# Patient Record
Sex: Female | Born: 1990 | Race: White | Hispanic: No | Marital: Single | Smoking: Never smoker
Health system: Southern US, Community
[De-identification: ages and names within clinical notes are randomized; demographics above are authoritative.]

## PROBLEM LIST (undated history)

## (undated) DIAGNOSIS — N946 Dysmenorrhea, unspecified: Secondary | ICD-10-CM

## (undated) HISTORY — DX: Dysmenorrhea, unspecified: N94.6

## (undated) HISTORY — PX: SKIN BIOPSY: SHX1

## (undated) HISTORY — PX: NO PAST SURGERIES: SHX2092

---
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
## 2012-05-10 ENCOUNTER — Other Ambulatory Visit: Payer: Self-pay | Admitting: Internal Medicine

## 2012-05-10 ENCOUNTER — Telehealth: Payer: Self-pay | Admitting: Internal Medicine

## 2012-05-10 MED ORDER — NORETHINDRONE-ETH ESTRADIOL 1-35 MG-MCG PO TABS: 1.0000 | ORAL_TABLET | Freq: Every day | ORAL | Status: DC

## 2012-05-10 NOTE — Telephone Encounter (Signed)
Vickie/Pharmacist w/ [facility] in [place], [phone],  calling regarding Nicon birth control is no longer manufactured, need substitute.  PLEASE FOLLOW UP W/ PHARMACY OR RESEND ESCRIPT.

## 2012-05-11 MED ORDER — NORETHINDRONE-ETH ESTRADIOL 1-35 MG-MCG PO TABS: 1.0000 | ORAL_TABLET | Freq: Every day | ORAL | Status: DC

## 2012-05-11 NOTE — Progress Notes (Signed)
Med faxed to [facility] in [place].

## 2013-05-18 ENCOUNTER — Telehealth: Payer: Self-pay | Admitting: *Deleted

## 2013-05-18 NOTE — Telephone Encounter (Signed)
Refill Request  Alyacen 1-35-28 tablet   #28  Take one tablet by mouth every day

## 2013-05-18 NOTE — Telephone Encounter (Signed)
Call Pt and told her we are unable to refill her Rx request due to not being seen in office since 06/21/11. Told pt she will need to make an appt with PCP to renew Rx. Pt verbalized understanding and said she would call back to schedule.

## 2013-05-21 ENCOUNTER — Telehealth: Payer: Self-pay | Admitting: *Deleted

## 2013-05-21 NOTE — Telephone Encounter (Signed)
Refill Request  Alyacen 1-35-28 tablet  Take one tablet by mouth every day

## 2013-05-21 NOTE — Telephone Encounter (Signed)
Medication will not be refilled, please refer to previous phone encounter

## 2015-03-24 ENCOUNTER — Ambulatory Visit: Payer: BC Managed Care – PPO | Admitting: Internal Medicine

## 2015-03-26 ENCOUNTER — Ambulatory Visit (INDEPENDENT_AMBULATORY_CARE_PROVIDER_SITE_OTHER): Payer: BC Managed Care – PPO | Admitting: Internal Medicine

## 2015-03-26 ENCOUNTER — Encounter: Payer: Self-pay | Admitting: Internal Medicine

## 2015-03-26 VITALS — BP 122/80 | HR 63 | Temp 98.1°F | Resp 12 | Ht 71.0 in | Wt 221.8 lb

## 2015-03-26 DIAGNOSIS — M25561 Pain in right knee: Secondary | ICD-10-CM

## 2015-03-26 DIAGNOSIS — M25562 Pain in left knee: Secondary | ICD-10-CM

## 2015-03-26 DIAGNOSIS — E669 Obesity, unspecified: Secondary | ICD-10-CM

## 2015-03-26 DIAGNOSIS — R5383 Other fatigue: Secondary | ICD-10-CM | POA: Diagnosis not present

## 2015-03-26 DIAGNOSIS — E559 Vitamin D deficiency, unspecified: Secondary | ICD-10-CM | POA: Diagnosis not present

## 2015-03-26 NOTE — Progress Notes (Signed)
Subjective:  Patient ID: Kristina Woodard, female    DOB: Aug 14, 1990  Age: 25 y.o. MRN: [id number]  CC: The primary encounter diagnosis was Other fatigue. Diagnoses of Obesity, Vitamin D deficiency, and Bilateral knee pain were also pertinent to this visit.  HPI Kristina Woodard presents for bilateral knee pain.. patient has not been seen since 2013  History of right knee patella dislocation from blunt trauma that occurrent when she was an adolescent and her dog ran into her knee .  The patellar dislocation was reduced by ER physician  Right knee has been intermittently problematic .  But the left knee is more often a problem.   She reports pain with exercise and with pronged rest.  The pain is diffuse and anterior in location.  She denies puffiness and warmth to either joints. .     History Kristina Woodard has no past medical history on file.   She has no past surgical history on file.   Her family history includes Cancer in her maternal grandmother and paternal aunt.She reports that she has never smoked. She has never used smokeless tobacco. She reports that she drinks alcohol. She reports that she does not use illicit drugs.  Outpatient Prescriptions Prior to Visit  Medication Sig Dispense Refill  . norethindrone-ethinyl estradiol 1/35 (ORTHO-NOVUM 1/35, 28,) tablet Take 1 tablet by mouth daily. (Patient not taking: Reported on 03/26/2015) 1 Package 11  . Norethindrone-Ethinyl Estradiol Biphasic (NECON 10/11, 28,) 0.5-35/1-35 MG-MCG tablet Take 1 tablet by mouth daily. 1 Package 11  . Clindamycin Phos-Benzoyl Perox (ACANYA) gel Apply topically 2 (two) times daily. Reported on 03/26/2015    . tretinoin (RETIN-A) 0.1 % cream Apply topically at bedtime. Reported on 03/26/2015     No facility-administered medications prior to visit.    Review of Systems:  Patient denies headache, fevers, malaise, unintentional weight loss, skin rash, eye pain, sinus congestion and sinus pain, sore throat,  dysphagia,  hemoptysis , cough, dyspnea, wheezing, chest pain, palpitations, orthopnea, edema, abdominal pain, nausea, melena, diarrhea, constipation, flank pain, dysuria, hematuria, urinary  Frequency, nocturia, numbness, tingling, seizures,  Focal weakness, Loss of consciousness,  Tremor, insomnia, depression, anxiety, and suicidal ideation.     Objective:  BP 122/80 mmHg  Pulse 63  Temp(Src) 98.1 F (36.7 C) (Oral)  Resp 12  Ht  (1.803 m)  Wt 221 lb 12 oz (100.585 kg)  BMI 30.94 kg/m2  SpO2 96%  LMP 03/19/2015 (Approximate)  Physical Exam:  General appearance: alert, cooperative and appears stated age Ears: normal TM's and external ear canals both ears Throat: lips, mucosa, and tongue normal; teeth and gums normal Neck: no adenopathy, no carotid bruit, supple, symmetrical, trachea midline and thyroid not enlarged, symmetric, no tenderness/mass/nodules Back: symmetric, no curvature. ROM normal. No CVA tenderness. Lungs: clear to auscultation bilaterally Heart: regular rate and rhythm, S1, S2 normal, no murmur, click, rub or gallop Abdomen: soft, non-tender; bowel sounds normal; no masses,  no organomegaly Pulses: 2+ and symmetric Skin: Skin color, texture, turgor normal. No rashes or lesions Lymph nodes: Cervical, supraclavicular, and axillary nodes normal.   Assessment & Plan:   Problem List Items Addressed This Visit    Bilateral knee pain    Likely secondary to degenerative changes . She wants to avoid medications and wants to be proactive to prevent damage. .  Discussed using turmeric   Referral to sports medicine       Relevant Orders   Ambulatory referral to Sports Medicine   Vitamin D  deficiency   Relevant Orders   VITAMIN D 25 Hydroxy (Vit-D Deficiency, Fractures) (Completed)    Other Visit Diagnoses    Other fatigue    -  Primary    Relevant Orders    Comprehensive metabolic panel (Completed)    TSH (Completed)    CBC with Differential/Platelet  (Completed)    Obesity        Relevant Orders    Lipid panel (Completed)       I have discontinued Ms. Stretch's Clindamycin Phos-Benzoyl Perox and tretinoin. I am also having her start on ergocalciferol. Additionally, I am having her maintain her Norethindrone-Ethinyl Estradiol Biphasic, norethindrone-ethinyl estradiol 1/35, and Adapalene-Benzoyl Peroxide (EPIDUO FORTE EX).  Meds ordered this encounter  Medications  . Adapalene-Benzoyl Peroxide (EPIDUO FORTE EX)    Sig: Apply 1 application topically daily.  . ergocalciferol (DRISDOL) 50000 units capsule    Sig: Take 1 capsule (50,000 Units total) by mouth once a week.    Dispense:  12 capsule    Refill:  0    Medications Discontinued During This Encounter  Medication Reason  . Clindamycin Phos-Benzoyl Perox (ACANYA) gel Change in therapy  . tretinoin (RETIN-A) 0.1 % cream Completed Course    Follow-up: No Follow-up on file.   Sherlene Shams, MD

## 2015-03-26 NOTE — Progress Notes (Signed)
Pre-visit discussion using our clinic review tool. No additional management support is needed unless otherwise documented below in the visit note.  

## 2015-03-26 NOTE — Patient Instructions (Signed)
I am referring you to Dr Terrilee Files to evaluate your knees  You can use turmeric capsules as a natural anti inflammatory along with ice  Avoid the Stair master for now.  Ok to continue the [employer]

## 2015-03-27 LAB — CBC WITH DIFFERENTIAL/PLATELET
BASOS ABS: 0 10*3/uL (ref 0.0–0.1)
Basophils Relative: 0.3 % (ref 0.0–3.0)
EOS ABS: 0.3 10*3/uL (ref 0.0–0.7)
Eosinophils Relative: 4 % (ref 0.0–5.0)
HEMATOCRIT: 40 % (ref 36.0–46.0)
HEMOGLOBIN: 13.5 g/dL (ref 12.0–15.0)
LYMPHS PCT: 39.2 % (ref 12.0–46.0)
Lymphs Abs: 2.5 10*3/uL (ref 0.7–4.0)
MCHC: 33.8 g/dL (ref 30.0–36.0)
MCV: 85.9 fl (ref 78.0–100.0)
Monocytes Absolute: 0.4 10*3/uL (ref 0.1–1.0)
Monocytes Relative: 6.7 % (ref 3.0–12.0)
NEUTROS ABS: 3.1 10*3/uL (ref 1.4–7.7)
Neutrophils Relative %: 49.8 % (ref 43.0–77.0)
PLATELETS: 371 10*3/uL (ref 150.0–400.0)
RBC: 4.66 Mil/uL (ref 3.87–5.11)
RDW: 12.8 % (ref 11.5–15.5)
WBC: 6.3 10*3/uL (ref 4.0–10.5)

## 2015-03-27 LAB — COMPREHENSIVE METABOLIC PANEL
ALBUMIN: 4.8 g/dL (ref 3.5–5.2)
ALK PHOS: 33 U/L — AB (ref 39–117)
ALT: 27 U/L (ref 0–35)
AST: 19 U/L (ref 0–37)
BUN: 11 mg/dL (ref 6–23)
CALCIUM: 9.8 mg/dL (ref 8.4–10.5)
CO2: 23 mEq/L (ref 19–32)
Chloride: 107 mEq/L (ref 96–112)
Creatinine, Ser: 0.68 mg/dL (ref 0.40–1.20)
GFR: 114.71 mL/min (ref >60.00)
Glucose, Bld: 86 mg/dL (ref 70–99)
POTASSIUM: 3.8 meq/L (ref 3.5–5.1)
Sodium: 144 mEq/L (ref 135–145)
TOTAL PROTEIN: 8.1 g/dL (ref 6.0–8.3)
Total Bilirubin: 0.4 mg/dL (ref 0.2–1.2)

## 2015-03-27 LAB — TSH: TSH: 0.78 u[IU]/mL (ref 0.35–4.50)

## 2015-03-27 LAB — LIPID PANEL
CHOL/HDL RATIO: 3
Cholesterol: 129 mg/dL (ref 0–200)
HDL: 48.7 mg/dL (ref >39.00)
LDL Cholesterol: 63 mg/dL (ref 0–99)
NonHDL: 80.25
TRIGLYCERIDES: 87 mg/dL (ref 0.0–149.0)
VLDL: 17.4 mg/dL (ref 0.0–40.0)

## 2015-03-27 LAB — VITAMIN D 25 HYDROXY (VIT D DEFICIENCY, FRACTURES): VITD: 21.98 ng/mL — AB (ref 30.00–100.00)

## 2015-03-28 DIAGNOSIS — E559 Vitamin D deficiency, unspecified: Secondary | ICD-10-CM | POA: Insufficient documentation

## 2015-03-28 MED ORDER — ERGOCALCIFEROL 1.25 MG (50000 UT) PO CAPS: 50000.0000 [IU] | ORAL_CAPSULE | ORAL | Status: DC

## 2015-03-29 DIAGNOSIS — M25562 Pain in left knee: Secondary | ICD-10-CM

## 2015-03-29 DIAGNOSIS — M25561 Pain in right knee: Secondary | ICD-10-CM | POA: Insufficient documentation

## 2015-03-29 NOTE — Assessment & Plan Note (Signed)
Likely secondary to degenerative changes . She wants to avoid medications and wants to be proactive to prevent damage. .  Discussed using turmeric   Referral to sports medicine

## 2015-04-10 ENCOUNTER — Ambulatory Visit: Payer: BC Managed Care – PPO | Admitting: Family Medicine

## 2015-04-16 ENCOUNTER — Ambulatory Visit (INDEPENDENT_AMBULATORY_CARE_PROVIDER_SITE_OTHER): Payer: BC Managed Care – PPO | Admitting: Family Medicine

## 2015-04-16 ENCOUNTER — Encounter: Payer: Self-pay | Admitting: Family Medicine

## 2015-04-16 ENCOUNTER — Other Ambulatory Visit (INDEPENDENT_AMBULATORY_CARE_PROVIDER_SITE_OTHER): Payer: BC Managed Care – PPO

## 2015-04-16 VITALS — BP 124/80 | HR 92 | Wt 217.0 lb

## 2015-04-16 DIAGNOSIS — M222X1 Patellofemoral disorders, right knee: Secondary | ICD-10-CM | POA: Diagnosis not present

## 2015-04-16 DIAGNOSIS — M25561 Pain in right knee: Secondary | ICD-10-CM | POA: Diagnosis not present

## 2015-04-16 DIAGNOSIS — M25562 Pain in left knee: Secondary | ICD-10-CM | POA: Diagnosis not present

## 2015-04-16 DIAGNOSIS — M222X2 Patellofemoral disorders, left knee: Secondary | ICD-10-CM

## 2015-04-16 DIAGNOSIS — E559 Vitamin D deficiency, unspecified: Secondary | ICD-10-CM

## 2015-04-16 MED ORDER — DICLOFENAC SODIUM 2 % TD SOLN: 2.0000 "application " | Freq: Two times a day (BID) | TRANSDERMAL | Status: DC

## 2015-04-16 NOTE — Patient Instructions (Signed)
Good to see you.  Ice 20 minutes 2 times daily. Usually after activity and before bed. Exercises 3 times a week.  Biking for cardio for now Avoid high impact like jumping or running pennsaid pinkie amount topically 2 times daily as needed.  Continue the vitamin D  Turmeric 500mg  daily could help with inflammation  See me again in 3-6 weeks.

## 2015-04-16 NOTE — Assessment & Plan Note (Signed)
Patellofemoral Syndrome  Reviewed anatomy using anatomical model and how PFS occurs.  Given rehab exercises handout for VMO, hip abductors, core, entire kinetic chain including proprioception exercises including cone touches, step downs, hip elevations and turn outs.  Could benefit from PT, regular exercise, upright biking, and a PFS knee brace to assist with tracking abnormalities.  return to clinic in 3-6 weeks. Patient come for topical anti-and plantar is given.

## 2015-04-16 NOTE — Progress Notes (Signed)
Tawana ScaleZach Nevada Kirchner D.O. [facility] [address] [place], [place] [address] Phone: [phone] Subjective:    I'm seeing this patient by the request  of:  TULLO, TERESA L, MD   CC:  Bilateral knee pain right greater than left  MVE:[id number]:Subjective Kristina Woodard is a 25 y.o. female coming in with complaint of  Bilateral knee pain. Patient does give a past medical history significant for right-sided patella dislocation many years ago that did cause bracing for greater than 6 weeks she states. Patient did not have to have any surgical intervention.  Patient states that she did start increasing her activity and started working on a more regular basis. Patient noticed when she did more activity she started having more anterior knee pain. Patient states sometimes it felt like there is a fullness in her knees. Seems to be worse in the right and left side. Denies any giving out on her. Denies any numbness or tingling. Rates the severity of pain a 6 out of 10 when it did get bad. States that when she does not do as much activity it seems to get better.     No past medical history on file. history of a patellar's dislocation on the right knee many years ago No past surgical history on file. Social History   Social History  . Marital Status: Single    Spouse Name: N/A  . Number of Children: N/A  . Years of Education: N/A   Social History Main Topics  . Smoking status: Never Smoker   . Smokeless tobacco: Never Used  . Alcohol Use: Yes  . Drug Use: No  . Sexual Activity: Not Asked   Other Topics Concern  . None   Social History Narrative   Allergies  Allergen Reactions  . Benadryl [Diphenhydramine]    Family History  Problem Relation Age of Onset  . Cancer Paternal Aunt     ovarian  . Cancer Maternal Grandmother     melanoma ,  metastatic colon Ca    Past medical history, social, surgical and family history all reviewed in electronic medical record.  No pertanent information  unless stated regarding to the chief complaint.   Review of Systems: No headache, visual changes, nausea, vomiting, diarrhea, constipation, dizziness, abdominal pain, skin rash, fevers, chills, night sweats, weight loss, swollen lymph nodes, body aches, joint swelling, muscle aches, chest pain, shortness of breath, mood changes.   Objective Blood pressure 124/80, pulse 92, weight 217 lb (98.431 kg), last menstrual period 03/19/2015, SpO2 99 %.  General: No apparent distress alert and oriented x3 mood and affect normal, dressed appropriately.  HEENT: Pupils equal, extraocular movements intact  Respiratory: Patient's speak in full sentences and does not appear short of breath  Cardiovascular: No lower extremity edema, non tender, no erythema  Skin: Warm dry intact with no signs of infection or rash on extremities or on axial skeleton.  Abdomen: Soft nontender  Neuro: Cranial nerves II through XII are intact, neurovascularly intact in all extremities with 2+ DTRs and 2+ pulses.  Lymph: No lymphadenopathy of posterior or anterior cervical chain or axillae bilaterally.  Gait normal with good balance and coordination.  MSK:  Non tender with full range of motion and good stability and symmetric strength and tone of shoulders, elbows, wrist, hip, and ankles bilaterally.  Knee: Bilateral Normal to inspection with no erythema or effusion or obvious bony abnormalities. Mild lateral  Tilt of the knees Bilaterally Palpation normal with no warmth, joint line tenderness,  patellar tenderness, or condyle tenderness. ROM full in flexion and extension and lower leg rotation. Ligaments with solid consistent endpoints including ACL, PCL, LCL, MCL. Negative Mcmurray's, Apley's, and Thessalonian tests. Non painful patellar compression. Patellar glide without crepitus. Patellar and quadriceps tendons unremarkable. Hamstring and quadriceps strength is normal.   MSK US performed of:  bilateral This study was  ordered, performed, and interpreted by Terrilee Files D.O.  Knee: All structures visualized. Anteromedial, anterolateral, posteromedial, and posterolateral menisci unremarkable without tearing, fraying, effusion, or displacement. Patellar Tendon unremarkable on long and transverse views without effusion. No abnormality of prepatellar bursa. LCL and MCL unremarkable on long and transverse views. No abnormality of origin of medial or lateral head of the gastrocnemius.  IMPRESSION:  NORMAL ULTRASONOGRAPHIC EXAMINATION OF THE KNEE.   Procedure note 97110; 15 minutes spent for Therapeutic exercises as stated in above notes.  This included exercises focusing on stretching, strengthening, with significant focus on eccentric aspects.   Patellofemoral Syndrome Rehab  Isometric contractions of thigh - 10 x 10 secs  3 way straight leg raises - build to 3 sets of 30 and then add weights begin with no weight. When 3 x 30 reached, Add 2 lb. ankle weight. Increase to 3,4,5,6 when 3x30 achieved.  Drop squats - limit to 45 deg, 3x15  Modified lunge - running position, 3x15  Seated quad extensions, 3x15, add ankle weights  Step downs, 3x15 with body weight slowly on downward phase  Knee up and open hip: knee up and externally rotate hip to open position, hold 2 sec and repeat each leg, 30 reps  Cone Drills: Right Leg, Right Hand Right Leg, Left Hand Left Leg, Right Hand Left Leg, Left Hand Start with 1 cone, progress to 3 20 each exercise  Lateral Leg Reach Balance knee, reach out laterally to cone and touch. Hold at Knee up position for 2 seconds 20 reps each leg  Rear Leg Reach Directly behind - place cone 20 reps  Proper technique shown and discussed handout in great detail with ATC.  All questions were discussed and answered.   Impression and Recommendations:     This case required medical decision making of moderate complexity.      Note: This dictation was prepared with  Dragon dictation along with smaller phrase technology. Any transcriptional errors that result from this process are unintentional.

## 2015-04-16 NOTE — Assessment & Plan Note (Signed)
Encourage patient to continue the once weekly vitamin D. 

## 2015-05-22 ENCOUNTER — Ambulatory Visit: Payer: BC Managed Care – PPO | Admitting: Family Medicine

## 2015-06-25 ENCOUNTER — Other Ambulatory Visit: Payer: Self-pay | Admitting: Internal Medicine

## 2015-10-06 ENCOUNTER — Ambulatory Visit: Payer: BC Managed Care – PPO | Admitting: Family

## 2015-12-29 ENCOUNTER — Encounter: Payer: BC Managed Care – PPO | Admitting: Internal Medicine

## 2016-02-13 ENCOUNTER — Ambulatory Visit (INDEPENDENT_AMBULATORY_CARE_PROVIDER_SITE_OTHER): Payer: BC Managed Care – PPO | Admitting: Internal Medicine

## 2016-02-13 ENCOUNTER — Encounter: Payer: Self-pay | Admitting: Internal Medicine

## 2016-02-13 ENCOUNTER — Other Ambulatory Visit (HOSPITAL_COMMUNITY)
Admission: RE | Admit: 2016-02-13 | Discharge: 2016-02-13 | Disposition: A | Discharge status: Home / Self Care | Payer: BC Managed Care – PPO | Source: Ambulatory Visit | Attending: Internal Medicine | Admitting: Internal Medicine

## 2016-02-13 VITALS — BP 108/64 | HR 92 | Temp 97.6°F | Resp 16 | Ht 71.0 in | Wt 205.0 lb

## 2016-02-13 DIAGNOSIS — Z23 Encounter for immunization: Secondary | ICD-10-CM

## 2016-02-13 DIAGNOSIS — Z3009 Encounter for other general counseling and advice on contraception: Secondary | ICD-10-CM

## 2016-02-13 DIAGNOSIS — Z30430 Encounter for insertion of intrauterine contraceptive device: Secondary | ICD-10-CM

## 2016-02-13 DIAGNOSIS — Z113 Encounter for screening for infections with a predominantly sexual mode of transmission: Secondary | ICD-10-CM | POA: Diagnosis not present

## 2016-02-13 DIAGNOSIS — Z0000 Encounter for general adult medical examination without abnormal findings: Secondary | ICD-10-CM | POA: Diagnosis not present

## 2016-02-13 DIAGNOSIS — Z01419 Encounter for gynecological examination (general) (routine) without abnormal findings: Secondary | ICD-10-CM | POA: Diagnosis present

## 2016-02-13 DIAGNOSIS — R5383 Other fatigue: Secondary | ICD-10-CM

## 2016-02-13 DIAGNOSIS — E7800 Pure hypercholesterolemia, unspecified: Secondary | ICD-10-CM

## 2016-02-13 DIAGNOSIS — Z124 Encounter for screening for malignant neoplasm of cervix: Secondary | ICD-10-CM

## 2016-02-13 DIAGNOSIS — E559 Vitamin D deficiency, unspecified: Secondary | ICD-10-CM

## 2016-02-13 DIAGNOSIS — Z1151 Encounter for screening for human papillomavirus (HPV): Secondary | ICD-10-CM | POA: Diagnosis present

## 2016-02-13 MED ORDER — ONDANSETRON 4 MG PO TBDP: 4.0000 mg | ORAL_TABLET | Freq: Three times a day (TID) | ORAL | 0 refills | Status: DC | PRN

## 2016-02-13 MED ORDER — IBUPROFEN 600 MG PO TABS: 600.0000 mg | ORAL_TABLET | Freq: Three times a day (TID) | ORAL | 0 refills | Status: DC | PRN

## 2016-02-13 NOTE — Progress Notes (Signed)
Patient ID: Kristina Woodard, female    DOB: 1990-03-30  Age: 26 y.o. MRN: [id number]  The patient is here for annual  gynexamination and management of other chronic and acute problems.  Has lost 12 lbs since march  Wants to discuss birth control. IUD preferred  Gyn referral needed  Immunization records needed,  periods are regular In occurrence and very heavy for 2 days,  Last 6-7 days at times has severe cramps with nausea. On  day 2.   Works Engineering geologist at [employer]   The risk factors are reflected in the social history.  The roster of all physicians providing medical care to patient - is listed in the Snapshot section of the chart.  Home safety : The patient has smoke detectors in the home. They wear seatbelts.  There are no firearms at home. There is no violence in the home.   There is no risks for hepatitis, STDs or HIV. There is no   history of blood transfusion. They have no travel history to infectious disease endemic areas of the world.  The patient has seen their dentist in the last six month. They have seen their eye doctor in the last year.    They do not  have excessive sun exposure. Discussed the need for sun protection: hats, long sleeves and use of sunscreen if there is significant sun exposure.   Diet: the importance of a healthy diet is discussed. They do have a healthy diet.  The benefits of regular aerobic exercise were discussed. She exercises 3,  60 minutes.   Depression screen: there are no signs or vegative symptoms of depression- irritability, change in appetite, anhedonia, sadness/tearfullness.  The following portions of the patient's history were reviewed and updated as appropriate: allergies, current medications, past family history, past medical history,  past surgical history, past social history  and problem list.  Visual acuity was not assessed per patient preference since she has regular follow up with her ophthalmologist. Hearing and body mass index were assessed  and reviewed.   During the course of the visit the patient was educated and counseled about appropriate screening and preventive services including : fall prevention , diabetes screening, nutrition counseling, colorectal cancer screening, and recommended immunizations.    CC: The primary encounter diagnosis was Screen for STD (sexually transmitted disease). Diagnoses of Cervical cancer screening, Encounter for insertion of intrauterine contraceptive device (IUD), Encounter for immunization, Vitamin D deficiency, Fatigue, unspecified type, Pure hypercholesterolemia, Encounter for preventive health examination, and Encounter for counseling regarding contraception were also pertinent to this visit.  History Kristina Woodard has no past medical history on file.   She has no past surgical history on file.   Her family history includes Cancer in her maternal grandmother and paternal aunt.She reports that she has never smoked. She has never used smokeless tobacco. She reports that she drinks alcohol. She reports that she does not use drugs.  Outpatient Medications Prior to Visit  Medication Sig Dispense Refill  . Diclofenac Sodium (PENNSAID) 2 % SOLN Place 2 application onto the skin 2 (two) times daily. 112 g 3  . Vitamin D, Ergocalciferol, (DRISDOL) 50000 units CAPS capsule TAKE 1 CAPSULE BY MOUTH ONCE WEEKLY 12 capsule 3  . Adapalene-Benzoyl Peroxide (EPIDUO FORTE EX) Apply 1 application topically daily.    . norethindrone-ethinyl estradiol 1/35 (ORTHO-NOVUM 1/35, 28,) tablet Take 1 tablet by mouth daily. (Patient not taking: Reported on 03/26/2015) 1 Package 11  . Norethindrone-Ethinyl Estradiol Biphasic (NECON 10/11, 28,) 0.5-35/1-35 MG-MCG tablet  Take 1 tablet by mouth daily. 1 Package 11   No facility-administered medications prior to visit.     Review of Systems   Patient denies headache, fevers, malaise, unintentional weight loss, skin rash, eye pain, sinus congestion and sinus pain, sore throat,  dysphagia,  hemoptysis , cough, dyspnea, wheezing, chest pain, palpitations, orthopnea, edema, abdominal pain, nausea, melena, diarrhea, constipation, flank pain, dysuria, hematuria, urinary  Frequency, nocturia, numbness, tingling, seizures,  Focal weakness, Loss of consciousness,  Tremor, insomnia, depression, anxiety, and suicidal ideation.      Objective:  BP 108/64   Pulse 92   Temp 97.6 F (36.4 C) (Oral)   Resp 16   Ht 5\' 11"  (1.803 m)   Wt 205 lb (93 kg)   LMP 01/26/2016   SpO2 100%   BMI 28.59 kg/m   Physical Exam   General Appearance:    Alert, cooperative, no distress, appears stated age  Head:    Normocephalic, without obvious abnormality, atraumatic  Eyes:    PERRL, conjunctiva/corneas clear, EOM's intact, fundi    benign, both eyes  Ears:    Normal TM's and external ear canals, both ears  Nose:   Nares normal, septum midline, mucosa normal, no drainage    or sinus tenderness  Throat:   Lips, mucosa, and tongue normal; teeth and gums normal  Neck:   Supple, symmetrical, trachea midline, no adenopathy;    thyroid:  no enlargement/tenderness/nodules; no carotid   bruit or JVD  Back:     Symmetric, no curvature, ROM normal, no CVA tenderness  Lungs:     Clear to auscultation bilaterally, respirations unlabored  Chest Wall:    No tenderness or deformity   Heart:    Regular rate and rhythm, S1 and S2 normal, no murmur, rub   or gallop  Breast Exam:    No tenderness, masses, or nipple abnormality  Abdomen:     Soft, non-tender, bowel sounds active all four quadrants,    no masses, no organomegaly  Genitalia:    Pelvic: cervix normal in appearance, external genitalia normal, no adnexal masses or tenderness, no cervical motion tenderness, rectovaginal septum normal, uterus normal size, shape, and consistency and vagina normal without discharge  Extremities:   Extremities normal, atraumatic, no cyanosis or edema  Pulses:   2+ and symmetric all extremities  Skin:   Skin  color, texture, turgor normal, no rashes or lesions  Lymph nodes:   Cervical, supraclavicular, and axillary nodes normal  Neurologic:   CNII-XII intact, normal strength, sensation and reflexes    throughout      Assessment & Plan:   Problem List Items Addressed This Visit    Encounter for counseling regarding contraception    discussed alternatives to oral contraceptives, including depo Provera, Implanon and IUD.  Referral to dr cherry for IUD       Encounter for preventive health examination    Annual wellness  exam was done as well as a comprehensive physical exam  .  During the course of the visit the patient was educated and counseled about appropriate screening and preventive services and screenings were brought up to date for cervical and breast cancer .  She will return for fasting labs to provide samples for diabetes screening and lipid analysis with projected  10 year  risk for CAD. nutrition counseling, skin cancer screening has been recommended, along with review of the age appropriate recommended immunizations.  Printed recommendations for health maintenance screenings was given.  Vitamin D deficiency   Relevant Orders   VITAMIN D 25 Hydroxy (Vit-D Deficiency, Fractures)    Other Visit Diagnoses    Screen for STD (sexually transmitted disease)    -  Primary   Relevant Orders   Cytology - PAP   Cervical cancer screening       Relevant Orders   Cytology - PAP   Encounter for insertion of intrauterine contraceptive device (IUD)       Relevant Orders   Ambulatory referral to Obstetrics / Gynecology   Encounter for immunization       Relevant Orders   Flu Vaccine QUAD 36+ mos IM (Completed)   Fatigue, unspecified type       Relevant Orders   Comprehensive metabolic panel   HIV antibody   TSH   CBC with Differential/Platelet   Pure hypercholesterolemia       Relevant Orders   Lipid panel      I have discontinued Ms. Corrales's Norethindrone-Ethinyl Estradiol  Biphasic, norethindrone-ethinyl estradiol 1/35, and Adapalene-Benzoyl Peroxide (EPIDUO FORTE EX). I am also having her start on ondansetron and ibuprofen. Additionally, I am having her maintain her Diclofenac Sodium and Vitamin D (Ergocalciferol).  Meds ordered this encounter  Medications  . ondansetron (ZOFRAN ODT) 4 MG disintegrating tablet    Sig: Take 1 tablet (4 mg total) by mouth every 8 (eight) hours as needed for nausea or vomiting.    Dispense:  20 tablet    Refill:  0  . ibuprofen (ADVIL,MOTRIN) 600 MG tablet    Sig: Take 1 tablet (600 mg total) by mouth every 8 (eight) hours as needed.    Dispense:  30 tablet    Refill:  0    Medications Discontinued During This Encounter  Medication Reason  . Norethindrone-Ethinyl Estradiol Biphasic (NECON 10/11, 28,) 0.5-35/1-35 MG-MCG tablet Completed Course  . norethindrone-ethinyl estradiol 1/35 (ORTHO-NOVUM 1/35, 28,) tablet Completed Course  . Adapalene-Benzoyl Peroxide (EPIDUO FORTE EX) Completed Course    Follow-up: No Follow-up on file.   Sherlene ShamsULLO, Rex Magee L, MD

## 2016-02-13 NOTE — Patient Instructions (Addendum)
For your menstrual syndrome/cramps:   Start using otrin 600 mg every 6 hours on day 1 of period  Ok to continue using tylenol as well  Up to 2000 mg daily     You can use the "orally disintegrating" (ODT) zofran as needed for nausea   You received the flu vaccine today; you can use tylenol and motrin if you develop any body aches from it  Referral to Dr Valentino Saxonherry Rebecca Eaton/[facility] for IUD

## 2016-02-15 NOTE — Assessment & Plan Note (Signed)

## 2016-02-15 NOTE — Assessment & Plan Note (Signed)
discussed alternatives to oral contraceptives, including depo Provera, Implanon and IUD.  Referral to dr Valentino Saxoncherry for IUD

## 2016-02-16 LAB — CYTOLOGY - PAP
Bacterial vaginitis: NEGATIVE
Candida vaginitis: NEGATIVE
Chlamydia: NEGATIVE
Diagnosis: NEGATIVE
HPV: NOT DETECTED
Neisseria Gonorrhea: NEGATIVE
TRICH (WINDOWPATH): NEGATIVE

## 2016-02-19 ENCOUNTER — Other Ambulatory Visit (INDEPENDENT_AMBULATORY_CARE_PROVIDER_SITE_OTHER): Payer: BC Managed Care – PPO

## 2016-02-19 DIAGNOSIS — E7800 Pure hypercholesterolemia, unspecified: Secondary | ICD-10-CM | POA: Diagnosis not present

## 2016-02-19 DIAGNOSIS — E559 Vitamin D deficiency, unspecified: Secondary | ICD-10-CM | POA: Diagnosis not present

## 2016-02-19 DIAGNOSIS — R5383 Other fatigue: Secondary | ICD-10-CM | POA: Diagnosis not present

## 2016-02-19 LAB — CBC WITH DIFFERENTIAL/PLATELET
Basophils Absolute: 0 10*3/uL (ref 0.0–0.1)
Basophils Relative: 0.4 % (ref 0.0–3.0)
EOS PCT: 3.2 % (ref 0.0–5.0)
Eosinophils Absolute: 0.3 10*3/uL (ref 0.0–0.7)
HEMATOCRIT: 42.2 % (ref 36.0–46.0)
HEMOGLOBIN: 14.5 g/dL (ref 12.0–15.0)
LYMPHS ABS: 2.9 10*3/uL (ref 0.7–4.0)
Lymphocytes Relative: 36.1 % (ref 12.0–46.0)
MCHC: 34.3 g/dL (ref 30.0–36.0)
MCV: 84.4 fl (ref 78.0–100.0)
MONOS PCT: 9.5 % (ref 3.0–12.0)
Monocytes Absolute: 0.8 10*3/uL (ref 0.1–1.0)
Neutro Abs: 4.1 10*3/uL (ref 1.4–7.7)
Neutrophils Relative %: 50.8 % (ref 43.0–77.0)
Platelets: 353 10*3/uL (ref 150.0–400.0)
RBC: 5 Mil/uL (ref 3.87–5.11)
RDW: 13.1 % (ref 11.5–15.5)
WBC: 8.1 10*3/uL (ref 4.0–10.5)

## 2016-02-19 LAB — COMPREHENSIVE METABOLIC PANEL
ALT: 13 U/L (ref 0–35)
AST: 13 U/L (ref 0–37)
Albumin: 4.6 g/dL (ref 3.5–5.2)
Alkaline Phosphatase: 33 U/L — ABNORMAL LOW (ref 39–117)
BUN: 11 mg/dL (ref 6–23)
CALCIUM: 9.8 mg/dL (ref 8.4–10.5)
CHLORIDE: 103 meq/L (ref 96–112)
CO2: 27 meq/L (ref 19–32)
CREATININE: 0.62 mg/dL (ref 0.40–1.20)
GFR: 126.59 mL/min (ref >60.00)
Glucose, Bld: 68 mg/dL — ABNORMAL LOW (ref 70–99)
Potassium: 3.8 mEq/L (ref 3.5–5.1)
Sodium: 138 mEq/L (ref 135–145)
Total Bilirubin: 0.4 mg/dL (ref 0.2–1.2)
Total Protein: 8.1 g/dL (ref 6.0–8.3)

## 2016-02-19 LAB — VITAMIN D 25 HYDROXY (VIT D DEFICIENCY, FRACTURES): VITD: 21.25 ng/mL — ABNORMAL LOW (ref 30.00–100.00)

## 2016-02-19 LAB — LIPID PANEL
Cholesterol: 137 mg/dL (ref 0–200)
HDL: 48.2 mg/dL (ref >39.00)
LDL CALC: 59 mg/dL (ref 0–99)
NONHDL: 88.96
Total CHOL/HDL Ratio: 3
Triglycerides: 151 mg/dL — ABNORMAL HIGH (ref 0.0–149.0)
VLDL: 30.2 mg/dL (ref 0.0–40.0)

## 2016-02-19 LAB — TSH: TSH: 1.24 u[IU]/mL (ref 0.35–4.50)

## 2016-02-20 LAB — CERVICOVAGINAL ANCILLARY ONLY: HERPES (WINDOWPATH): NEGATIVE

## 2016-02-20 LAB — HIV ANTIBODY (ROUTINE TESTING W REFLEX): HIV 1&2 Ab, 4th Generation: NONREACTIVE

## 2016-02-22 ENCOUNTER — Other Ambulatory Visit: Payer: Self-pay | Admitting: Internal Medicine

## 2016-02-22 MED ORDER — VITAMIN D (ERGOCALCIFEROL) 1.25 MG (50000 UNIT) PO CAPS: 50000.0000 [IU] | ORAL_CAPSULE | ORAL | 2 refills | Status: DC

## 2016-03-04 ENCOUNTER — Ambulatory Visit (INDEPENDENT_AMBULATORY_CARE_PROVIDER_SITE_OTHER): Payer: BC Managed Care – PPO | Admitting: Certified Nurse Midwife

## 2016-03-04 ENCOUNTER — Encounter: Payer: Self-pay | Admitting: Certified Nurse Midwife

## 2016-03-04 VITALS — BP 147/91 | HR 72 | Ht 71.0 in | Wt 207.8 lb

## 2016-03-04 DIAGNOSIS — Z3009 Encounter for other general counseling and advice on contraception: Secondary | ICD-10-CM | POA: Diagnosis not present

## 2016-03-04 NOTE — Progress Notes (Signed)
GYN ENCOUNTER NOTE  Subjective:       Kristina Woodard is a 26 y.o. G0P0000 female is here to discuss IUD placement. She was referred by her PCP Dr Darrick Huntsmanullo.   Kristina Woodard has researched IUDs online and has watched several videos of IUD placements. She is certain that she wants one, but is not sure which option is right for her.  History significant for dysmenorrhea.   Denies difficulty breathing or respiratory distress, chest pain, abdominal pain, unexplained vaginal bleeding, and leg pain or swelling.    Gynecologic History  Patient's last menstrual period was 02/23/2016.   Contraception: OCP (estrogen/progesterone)   Last Pap: 02/2016. Results were: normal  Obstetric History OB History  Gravida Para Term Preterm AB Living  0 0 0 0 0 0  SAB TAB Ectopic Multiple Live Births  0 0 0 0 0        History reviewed. No pertinent past medical history.  History reviewed. No pertinent surgical history.  Current Outpatient Prescriptions on File Prior to Visit  Medication Sig Dispense Refill  . ibuprofen (ADVIL,MOTRIN) 600 MG tablet Take 1 tablet (600 mg total) by mouth every 8 (eight) hours as needed. 30 tablet 0  . ondansetron (ZOFRAN ODT) 4 MG disintegrating tablet Take 1 tablet (4 mg total) by mouth every 8 (eight) hours as needed for nausea or vomiting. 20 tablet 0  . Vitamin D, Ergocalciferol, (DRISDOL) 50000 units CAPS capsule Take 1 capsule (50,000 Units total) by mouth once a week. 4 capsule 2   No current facility-administered medications on file prior to visit.     Allergies  Allergen Reactions  . Benadryl [Diphenhydramine]     Social History   Social History  . Marital status: Single    Spouse name: N/A  . Number of children: N/A  . Years of education: N/A   Occupational History  . Not on file.   Social History Main Topics  . Smoking status: Never Smoker  . Smokeless tobacco: Never Used  . Alcohol use Yes  . Drug use: No  . Sexual activity: Yes    Birth  control/ protection: Condom   Other Topics Concern  . Not on file   Social History Narrative  . No narrative on file    Family History  Problem Relation Age of Onset  . Cancer Paternal Aunt     ovarian  . Cancer Maternal Grandmother     melanoma ,  metastatic colon Ca    The following portions of the patient's history were reviewed and updated as appropriate: allergies, current medications, past family history, past medical history, past social history, past surgical history and problem list.  Review of Systems  Review of Systems - History obtained from the patient Genito-Urinary ROS: positive for - dysmenorrhea   Objective:   BP (!) 147/91 (BP Location: Left Arm, Patient Position: Sitting, Cuff Size: Large)   Pulse 72   Ht 5\' 11"  (1.803 m)   Wt 207 lb 12.8 oz (94.3 kg)   LMP 02/23/2016   BMI 28.98 kg/m   Alert and oriented x 4  Physcial exam: not indicated  Assessment:   Birth control counseling  Plan:   1. Education given regarding options for contraception, including IUD placement. Handouts for Erlene QuanKyleena, Skyla, [place], and Mirena given.   2. RTC for IUD placement with the start of cycle  3. Advised 800 mg Motrin OTC 1 hour prior to IUD placement and no intercourse between now and IUD placement.  4. Encourage to call with any further needs, questions, or concerns.    Gunnar Bulla, CNM

## 2016-03-04 NOTE — Patient Instructions (Signed)
Intrauterine Device Insertion Most often, an intrauterine device (IUD) is inserted into the uterus to prevent pregnancy. There are 2 types of IUDs available:  Copper IUD-This type of IUD creates an environment that is not favorable to sperm survival. The mechanism of action of the copper IUD is not known for certain. It can stay in place for 10 years.  Hormone IUD-This type of IUD contains the hormone progestin (synthetic progesterone). The progestin thickens the cervical mucus and prevents sperm from entering the uterus, and it also thins the uterine lining. There is no evidence that the hormone IUD prevents implantation. One hormone IUD can stay in place for up to 5 years, and a different hormone IUD can stay in place for up to 3 years. An IUD is the most cost-effective birth control if left in place for the full duration. It may be removed at any time. LET YOUR HEALTH CARE PROVIDER KNOW ABOUT:  Any allergies you have.  All medicines you are taking, including vitamins, herbs, eye drops, creams, and over-the-counter medicines.  Previous problems you or members of your family have had with the use of anesthetics.  Any blood disorders you have.  Previous surgeries you have had.  Possibility of pregnancy.  Medical conditions you have. RISKS AND COMPLICATIONS  Generally, intrauterine device insertion is a safe procedure. However, as with any procedure, complications can occur. Possible complications include:  Accidental puncture (perforation) of the uterus.  Accidental placement of the IUD either in the muscle layer of the uterus (myometrium) or outside the uterus. If this happens, the IUD can be found essentially floating around the bowels and must be taken out surgically.  The IUD may fall out of the uterus (expulsion). This is more common in women who have recently had a child.   Pregnancy in the fallopian tube (ectopic).  Pelvic inflammatory disease (PID), which is infection of  the uterus and fallopian tubes. The risk of PID is slightly increased in the first 20 days after the IUD is placed, but the overall risk is still very low. BEFORE THE PROCEDURE  Schedule the IUD insertion for when you will have your menstrual period or right after, to make sure you are not pregnant. Placement of the IUD is better tolerated shortly after a menstrual cycle.  You may need to take tests or be examined to make sure you are not pregnant.  You may be required to take a pregnancy test.  You may be required to get checked for sexually transmitted infections (STIs) prior to placement. Placing an IUD in someone who has an infection can make the infection worse.  You may be given a pain reliever to take 1 or 2 hours before the procedure.  An exam will be performed to determine the size and position of your uterus.  Ask your health care provider about changing or stopping your regular medicines. PROCEDURE   A tool (speculum) is placed in the vagina. This allows your health care provider to see the lower part of the uterus (cervix).  The cervix is prepped with a medicine that lowers the risk of infection.  You may be given a medicine to numb each side of the cervix (intracervical or paracervical block). This is used to block and control any discomfort with insertion.  A tool (uterine sound) is inserted into the uterus to determine the length of the uterine cavity and the direction the uterus may be tilted.  A slim instrument (IUD inserter) is inserted through the cervical   canal and into your uterus.  The IUD is placed in the uterine cavity and the insertion device is removed.  The nylon string that is attached to the IUD and used for eventual IUD removal is trimmed. It is trimmed so that it lays high in the vagina, just outside the cervix. AFTER THE PROCEDURE  You may have bleeding after the procedure. This is normal. It varies from light spotting for a few days to menstrual-like  bleeding.  You may have mild cramping. This information is not intended to replace advice given to you by your health care provider. Make sure you discuss any questions you have with your health care provider. Document Released: 09/23/2010 Document Revised: 11/15/2012 Document Reviewed: 07/16/2012 Elsevier Interactive Patient Education  2017 [employer]  

## 2016-03-24 ENCOUNTER — Telehealth: Payer: Self-pay | Admitting: Certified Nurse Midwife

## 2016-03-24 ENCOUNTER — Other Ambulatory Visit: Payer: Self-pay | Admitting: *Deleted

## 2016-03-24 MED ORDER — MISOPROSTOL 100 MCG PO TABS: 100.0000 ug | ORAL_TABLET | Freq: Every evening | ORAL | 0 refills | Status: DC

## 2016-03-24 NOTE — Telephone Encounter (Signed)
Pt wants to talk to you about having her iud inserted Friday. She wil basically be off her period by then and asked if she needed a pill to dilate her cervix for the insertion. Can you call her and aswer her questions.

## 2016-03-24 NOTE — Telephone Encounter (Signed)
Called pt cytotec was sent in for pt

## 2016-03-25 ENCOUNTER — Other Ambulatory Visit: Payer: Self-pay | Admitting: *Deleted

## 2016-03-25 MED ORDER — MISOPROSTOL 100 MCG PO TABS: 100.0000 ug | ORAL_TABLET | Freq: Every evening | ORAL | 0 refills | Status: DC

## 2016-03-26 ENCOUNTER — Encounter: Payer: Self-pay | Admitting: Certified Nurse Midwife

## 2016-03-26 ENCOUNTER — Ambulatory Visit (INDEPENDENT_AMBULATORY_CARE_PROVIDER_SITE_OTHER): Payer: BC Managed Care – PPO | Admitting: Certified Nurse Midwife

## 2016-03-26 VITALS — BP 93/68 | HR 90 | Ht 71.0 in | Wt 204.0 lb

## 2016-03-26 DIAGNOSIS — Z30430 Encounter for insertion of intrauterine contraceptive device: Secondary | ICD-10-CM

## 2016-03-26 DIAGNOSIS — R102 Pelvic and perineal pain: Secondary | ICD-10-CM

## 2016-03-26 LAB — POCT URINE PREGNANCY: Preg Test, Ur: NEGATIVE

## 2016-03-26 NOTE — Progress Notes (Signed)
Kristina Woodard is a 26 y.o. year old 750P0000 Caucasian female who presents for placement of a Kyleena IUD.  Patient's last menstrual period was 03/21/2016 (exact date). BP 93/68   Pulse 90   Ht 5\' 11"  (1.803 m)   Wt 204 lb (92.5 kg)   LMP 03/21/2016 (Exact Date)   BMI 28.45 kg/m    Last sexual intercourse was 03/18/2016, and pregnancy test today was negative.  The risks and benefits of the method and placement have been thouroughly reviewed with the patient and all questions were answered.  Specifically the patient is aware of failure rate of 02/998, expulsion of the IUD and of possible perforation.  The patient is aware of irregular bleeding due to the method and understands the incidence of irregular bleeding diminishes with time.  Signed copy of informed consent in chart.   Time out was performed.  A medium plastic speculum was placed in the vagina.  The cervix was visualized, prepped using Betadine, and grasped with a single tooth tenaculum. The uterus was found to be retroflexed and it sounded to 6 cm.  Kyleena IUD placed per manufacturer's recommendations.   The strings were trimmed to 3 cm.  The patient was given post procedure instructions, including signs and symptoms of infection and to check for the strings after each menses or each month, and refraining from intercourse or anything in the vagina for 3 days.  She was given a [place] care card with date AngosturaKyleena placed, and date Rutha BouchardKyleena to be removed.    Gunnar BullaJenkins Michelle Brynn Reznik, CNM

## 2016-03-26 NOTE — Patient Instructions (Signed)

## 2016-04-01 ENCOUNTER — Telehealth: Payer: Self-pay | Admitting: Certified Nurse Midwife

## 2016-04-01 NOTE — Telephone Encounter (Signed)
Pt called and she had an IUD put I last week ands he was having a strange sensation and wanted to make sure if it was normal to have this.

## 2016-04-02 NOTE — Telephone Encounter (Signed)
Pt called back saying the feeling she was having has subsided.  She thinks she can wait until her appt. to be seen.

## 2016-04-02 NOTE — Telephone Encounter (Signed)
Left message to contact office

## 2016-04-09 ENCOUNTER — Telehealth: Payer: Self-pay | Admitting: Certified Nurse Midwife

## 2016-04-09 NOTE — Telephone Encounter (Signed)
Providers message left on pts voicemail to please schedule IUD check and US on Monday with instructions to go to ED if in severe pain.

## 2016-04-09 NOTE — Telephone Encounter (Signed)
Spoke with patient- she states she had an IUD insertion about 2 weeks ago and the cramping has been getting progressively worse. Ibuprofen helps some but she states she is getting fed up. Please advise

## 2016-04-09 NOTE — Telephone Encounter (Signed)
Schedule her for a IUD check and US on Monday, please. If she is in severe pain then she needs to go to the ED. JML

## 2016-04-09 NOTE — Telephone Encounter (Signed)
PT CALLED AND SHE HAD AN IUD PLACED A COUPLE OF WEEKS AGO AND SHE IS HAVING SOME ISSUE THAT SHE WASN'T SURE IF THEY WERE NORMAL OR NOT AND SHE WOULD LIKE A CALL BACK/

## 2016-04-12 ENCOUNTER — Ambulatory Visit (INDEPENDENT_AMBULATORY_CARE_PROVIDER_SITE_OTHER): Payer: BC Managed Care – PPO | Admitting: Certified Nurse Midwife

## 2016-04-12 ENCOUNTER — Encounter: Payer: BC Managed Care – PPO | Admitting: Certified Nurse Midwife

## 2016-04-12 ENCOUNTER — Ambulatory Visit (INDEPENDENT_AMBULATORY_CARE_PROVIDER_SITE_OTHER): Payer: BC Managed Care – PPO

## 2016-04-12 VITALS — BP 140/85 | HR 76 | Ht 71.0 in | Wt 208.7 lb

## 2016-04-12 DIAGNOSIS — N83201 Unspecified ovarian cyst, right side: Secondary | ICD-10-CM

## 2016-04-12 DIAGNOSIS — R102 Pelvic and perineal pain: Secondary | ICD-10-CM | POA: Diagnosis not present

## 2016-04-12 MED ORDER — IBUPROFEN 600 MG PO TABS: 600.0000 mg | ORAL_TABLET | Freq: Three times a day (TID) | ORAL | 0 refills | Status: DC | PRN

## 2016-04-12 NOTE — Progress Notes (Signed)
GYN ENCOUNTER NOTE  Subjective:       Kristina Woodard is a 26 y.o. G0P0000 female is here for gynecologic evaluation of the following issues: abdominal cramping and light vaginal bleeding x two and a half (2.5) weeks.   Kristina Woodard reports intermittent abdominal cramping since her IUD was placed on 03/26/2016. She also reports light vaginal spotting for the last few days.   She manages the cramping by taking Motrin 600 mg daily. She has perform several IUD string checks and her strings are in place.   Denies difficulty breathing or respiratory distress, chest pain, dysuria, and leg pain or swelling.    Gynecologic History  Patient's last menstrual period was 03/21/2016 (exact date).   Contraception: IUD  Obstetric History OB History  Gravida Para Term Preterm AB Living  0 0 0 0 0 0  SAB TAB Ectopic Multiple Live Births  0 0 0 0 0        Past Medical History:  Diagnosis Date  . Painful menstrual periods     Past Surgical History:  Procedure Laterality Date  . NO PAST SURGERIES      Current Outpatient Prescriptions on File Prior to Visit  Medication Sig Dispense Refill  . Levonorgestrel (KYLEENA) 19.5 MG IUD 1 Device by Intrauterine route once. Inserted 03/26/16    . Vitamin D, Ergocalciferol, (DRISDOL) 50000 units CAPS capsule Take 1 capsule (50,000 Units total) by mouth once a week. 4 capsule 2   No current facility-administered medications on file prior to visit.     Allergies  Allergen Reactions  . Benadryl [Diphenhydramine]     Social History   Social History  . Marital status: Single    Spouse name: N/A  . Number of children: N/A  . Years of education: N/A   Occupational History  . Not on file.   Social History Main Topics  . Smoking status: Never Smoker  . Smokeless tobacco: Never Used  . Alcohol use Yes  . Drug use: No  . Sexual activity: Yes    Birth control/ protection: Condom   Other Topics Concern  . Not on file   Social History Narrative   . No narrative on file    Family History  Problem Relation Age of Onset  . Cancer Paternal Aunt     ovarian  . Cancer Maternal Grandmother     melanoma ,  metastatic colon Ca    The following portions of the patient's history were reviewed and updated as appropriate: allergies, current medications, past family history, past medical history, past social history, past surgical history and problem list.  Review of Systems  Review of Systems - Negative except as noted above History obtained from the patient  Objective:   BP 140/85 (BP Location: Left Arm, Patient Position: Sitting, Cuff Size: Large)   Pulse 76   Ht 5\' 11"  (1.803 m)   Wt 208 lb 11.2 oz (94.7 kg)   LMP 03/21/2016 (Exact Date)   BMI 29.11 kg/m    Alert and oriented x 4  ULTRASOUND REPORT  Location: [facility] Date of Service: 04/12/16   Indications: Pelvic pain x 2.5 weeks (since introduction of IUD) Findings:  The uterus is retroverted and measures 6.5 x 3.7 x 3.6 cm. The IUD is seen on 3D imaging and appears to be in the proper placement within the uterus. Echo texture is homogenous without evidence of focal masses.  The Endometrium measures 7.6 mm.  Right Ovary measures 4.0 x 2.8 x  2.7 cm. There is a 1.9 x 1.2 x 1.4 cm complex cystic area seen with free fluid surrounding the right ovary and uterine cul-de-sac.  Left Ovary measures 2.7 x 2.3 x 1.4 cm, and appears WNL. Survey of the adnexa demonstrates no adnexal masses. There is a moderate amount of free fluid in the cul de sac and along the right adnexa surrounding the right ovary.  Impression: 1. IUD appears to be in proper position within the uterus.  2. Right ovarian cyst, possibly hemorrhagic with free fluid seen in the right adnexa and cul-de-sac.  Assessment:   Abdominal cramping  Right ovarian cyst  Plan:   Rx Motrin 600 mg, see orders RTC x 6 weeks for follow up US Keep previously scheduled IUD string check   A  total of 15 minutes were spent face-to-face with the patient during this encounter and over half of that time dealt with counseling and coordination of care.  Gunnar Bulla, CNM

## 2016-04-12 NOTE — Addendum Note (Signed)
Addended by: Marchelle FolksMILLER, Tionne Carelli G on: 04/12/2016 11:28 AM   Modules accepted: Orders

## 2016-05-06 ENCOUNTER — Ambulatory Visit (INDEPENDENT_AMBULATORY_CARE_PROVIDER_SITE_OTHER): Payer: BC Managed Care – PPO | Admitting: Certified Nurse Midwife

## 2016-05-06 ENCOUNTER — Encounter: Payer: Self-pay | Admitting: Certified Nurse Midwife

## 2016-05-06 VITALS — BP 116/88 | HR 76 | Wt 206.4 lb

## 2016-05-06 DIAGNOSIS — Z30431 Encounter for routine checking of intrauterine contraceptive device: Secondary | ICD-10-CM | POA: Diagnosis not present

## 2016-05-06 NOTE — Progress Notes (Signed)
GYN ENCOUNTER NOTE  Subjective:       Kristina Woodard is a 26 y.o. G0P0000 female is here for IUD string check. Her Rutha BouchardKyleena was placed 03/26/2016 by myself.   Reports regular menses since placement. She "likes her Rutha BouchardKyleena a lot".    Denies difficulty breathing or respiratory distress, chest pain, abdominal pain, unexplained vaginal bleeding, and leg pain or swelling.   Gynecologic History  Patient's last menstrual period was 04/16/2016 (exact date).   Contraception: IUD   Last Pap: 02/13/2016. Results were: normal  Obstetric History OB History  Gravida Para Term Preterm AB Living  0 0 0 0 0 0  SAB TAB Ectopic Multiple Live Births  0 0 0 0 0        Past Medical History:  Diagnosis Date  . Painful menstrual periods     Past Surgical History:  Procedure Laterality Date  . NO PAST SURGERIES      Current Outpatient Prescriptions on File Prior to Visit  Medication Sig Dispense Refill  . ibuprofen (ADVIL,MOTRIN) 600 MG tablet Take 1 tablet (600 mg total) by mouth every 8 (eight) hours as needed. 30 tablet 0  . Levonorgestrel (KYLEENA) 19.5 MG IUD 1 Device by Intrauterine route once. Inserted 03/26/16    . Vitamin D, Ergocalciferol, (DRISDOL) 50000 units CAPS capsule Take 1 capsule (50,000 Units total) by mouth once a week. 4 capsule 2   No current facility-administered medications on file prior to visit.     Allergies  Allergen Reactions  . Benadryl [Diphenhydramine]     Social History   Social History  . Marital status: Single    Spouse name: N/A  . Number of children: N/A  . Years of education: N/A   Occupational History  . Not on file.   Social History Main Topics  . Smoking status: Never Smoker  . Smokeless tobacco: Never Used  . Alcohol use Yes  . Drug use: No  . Sexual activity: Yes    Birth control/ protection: Condom   Other Topics Concern  . Not on file   Social History Narrative  . No narrative on file    Family History  Problem Relation  Age of Onset  . Cancer Paternal Aunt     ovarian  . Cancer Maternal Grandmother     melanoma ,  metastatic colon Ca    The following portions of the patient's history were reviewed and updated as appropriate: allergies, current medications, past family history, past medical history, past social history, past surgical history and problem list.  Review of Systems  Review of Systems - Negative except as noted above. History obtained from the patient  Objective:   BP 116/88   Pulse 76   Wt 206 lb 6.4 oz (93.6 kg)   LMP 04/16/2016 (Exact Date)   BMI 28.79 kg/m   CONSTITUTIONAL: Well-developed, well-nourished female in no acute distress.   ABDOMEN: Soft, non distended; Non tender.  No Organomegaly.  PELVIC:  External Genitalia: Normal  Vagina: Normal  Cervix: Normal, IUD strings present  Uterus: Normal size, shape,consistency, mobile  Adnexa: Normal  Assessment:   1. IUD check up  Plan:   Reviewed red flag symptoms and when to call  RTC in 2019 for annual exam or sooner if needed   Gunnar BullaJenkins Michelle Ohm Dentler, CNM

## 2016-05-06 NOTE — Patient Instructions (Addendum)
Preventive Care 18-39 Years, Female Preventive care refers to lifestyle choices and visits with your health care provider that can promote health and wellness. What does preventive care include?  A yearly physical exam. This is also called an annual well check.  Dental exams once or twice a year.  Routine eye exams. Ask your health care provider how often you should have your eyes checked.  Personal lifestyle choices, including:  Daily care of your teeth and gums.  Regular physical activity.  Eating a healthy diet.  Avoiding tobacco and drug use.  Limiting alcohol use.  Practicing safe sex.  Taking vitamin and mineral supplements as recommended by your health care provider. What happens during an annual well check? The services and screenings done by your health care provider during your annual well check will depend on your age, overall health, lifestyle risk factors, and family history of disease. Counseling  Your health care provider may ask you questions about your:  Alcohol use.  Tobacco use.  Drug use.  Emotional well-being.  Home and relationship well-being.  Sexual activity.  Eating habits.  Work and work environment.  Method of birth control.  Menstrual cycle.  Pregnancy history. Screening  You may have the following tests or measurements:  Height, weight, and BMI.  Diabetes screening. This is done by checking your blood sugar (glucose) after you have not eaten for a while (fasting).  Blood pressure.  Lipid and cholesterol levels. These may be checked every 5 years starting at age 20.  Skin check.  Hepatitis C blood test.  Hepatitis B blood test.  Sexually transmitted disease (STD) testing.  BRCA-related cancer screening. This may be done if you have a family history of breast, ovarian, tubal, or peritoneal cancers.  Pelvic exam and Pap test. This may be done every 3 years starting at age 21. Starting at age 30, this may be done every 5  years if you have a Pap test in combination with an HPV test. Discuss your test results, treatment options, and if necessary, the need for more tests with your health care provider. Vaccines  Your health care provider may recommend certain vaccines, such as:  Influenza vaccine. This is recommended every year.  Tetanus, diphtheria, and acellular pertussis (Tdap, Td) vaccine. You may need a Td booster every 10 years.  Varicella vaccine. You may need this if you have not been vaccinated.  HPV vaccine. If you are 26 or younger, you may need three doses over 6 months.  Measles, mumps, and rubella (MMR) vaccine. You may need at least one dose of MMR. You may also need a second dose.  Pneumococcal 13-valent conjugate (PCV13) vaccine. You may need this if you have certain conditions and were not previously vaccinated.  Pneumococcal polysaccharide (PPSV23) vaccine. You may need one or two doses if you smoke cigarettes or if you have certain conditions.  Meningococcal vaccine. One dose is recommended if you are age 19-21 years and a first-year college student living in a residence hall, or if you have one of several medical conditions. You may also need additional booster doses.  Hepatitis A vaccine. You may need this if you have certain conditions or if you travel or work in places where you may be exposed to hepatitis A.  Hepatitis B vaccine. You may need this if you have certain conditions or if you travel or work in places where you may be exposed to hepatitis B.  Haemophilus influenzae type b (Hib) vaccine. You may need this   if you have certain risk factors. Talk to your health care provider about which screenings and vaccines you need and how often you need them. This information is not intended to replace advice given to you by your health care provider. Make sure you discuss any questions you have with your health care provider. Document Released: 03/23/2001 Document Revised: 10/15/2015  Document Reviewed: 11/26/2014 Elsevier Interactive Patient Education  2017 [employer].

## 2016-05-23 ENCOUNTER — Other Ambulatory Visit: Payer: Self-pay | Admitting: Internal Medicine

## 2017-03-17 ENCOUNTER — Encounter: Payer: BC Managed Care – PPO | Admitting: Certified Nurse Midwife

## 2017-03-21 ENCOUNTER — Ambulatory Visit (INDEPENDENT_AMBULATORY_CARE_PROVIDER_SITE_OTHER): Payer: BC Managed Care – PPO | Admitting: Certified Nurse Midwife

## 2017-03-21 ENCOUNTER — Encounter: Payer: Self-pay | Admitting: Certified Nurse Midwife

## 2017-03-21 VITALS — BP 118/78 | HR 88 | Ht 71.0 in | Wt 234.9 lb

## 2017-03-21 DIAGNOSIS — Z01419 Encounter for gynecological examination (general) (routine) without abnormal findings: Secondary | ICD-10-CM

## 2017-03-21 DIAGNOSIS — Z6832 Body mass index (BMI) 32.0-32.9, adult: Secondary | ICD-10-CM | POA: Diagnosis not present

## 2017-03-21 DIAGNOSIS — Z975 Presence of (intrauterine) contraceptive device: Secondary | ICD-10-CM

## 2017-03-21 NOTE — Progress Notes (Signed)
ANNUAL PREVENTATIVE CARE GYN  ENCOUNTER NOTE  Subjective:       Kristina Woodard is a 27 y.o. G0P0000 female here for a routine annual gynecologic exam.    Doing well since IUD placement in 03/2016. Reports weight fluctuation over the last year. Currently, living with boyfriend and attending [employer].   Denies difficulty breathing or respiratory distress, chest pain, abdominal pain, vaginal bleeding, dysuria, and leg pain or swelling.    Gynecologic History  No LMP recorded. Patient is not currently having periods (Reason: IUD).  Contraception: IUD, Rutha Bouchard  Last Pap: 02/2016. Results were: normal  Obstetric History  OB History  Gravida Para Term Preterm AB Living  0 0 0 0 0 0  SAB TAB Ectopic Multiple Live Births  0 0 0 0 0        Past Medical History:  Diagnosis Date  . Painful menstrual periods     Past Surgical History:  Procedure Laterality Date  . NO PAST SURGERIES      Current Outpatient Medications on File Prior to Visit  Medication Sig Dispense Refill  . Levonorgestrel (KYLEENA) 19.5 MG IUD 1 Device by Intrauterine route once. Inserted 03/26/16    . ondansetron (ZOFRAN-ODT) 4 MG disintegrating tablet     . ibuprofen (ADVIL,MOTRIN) 600 MG tablet Take 1 tablet (600 mg total) by mouth every 8 (eight) hours as needed. (Patient not taking: Reported on 03/21/2017) 30 tablet 0  . Vitamin D, Ergocalciferol, (DRISDOL) 50000 units CAPS capsule Take 1 capsule (50,000 Units total) by mouth once a week. (Patient not taking: Reported on 03/21/2017) 4 capsule 2   No current facility-administered medications on file prior to visit.     Allergies  Allergen Reactions  . Benadryl [Diphenhydramine]     Social History   Socioeconomic History  . Marital status: In a relationship    Spouse name: Not on file  . Number of children: Not on file  . Years of education: Not on file  . Highest education level: Not on file  Social Needs  . Financial resource strain: Not on  file  . Food insecurity - worry: Not on file  . Food insecurity - inability: Not on file  . Transportation needs - medical: Not on file  . Transportation needs - non-medical: Not on file  Occupational History  . Not on file  Tobacco Use  . Smoking status: Never Smoker  . Smokeless tobacco: Never Used  Substance and Sexual Activity  . Alcohol use: Yes  . Drug use: No  . Sexual activity: Yes    Birth control/protection: IUD    Comment: kyleena  Other Topics Concern  . Not on file  Social History Narrative  . Not on file    Family History  Problem Relation Age of Onset  . Cancer Paternal Aunt        ovarian  . Cancer Maternal Grandmother        melanoma ,  metastatic colon Ca    The following portions of the patient's history were reviewed and updated as appropriate: allergies, current medications, past family history, past medical history, past social history, past surgical history and problem list.  Review of Systems  ROS negative except as noted above. Information obtained from patient.    Objective:   BP 118/78   Pulse 88   Ht 5\' 11"  (1.803 m)   Wt 234 lb 14.4 oz (106.5 kg)   BMI 32.76 kg/m    CONSTITUTIONAL: Well-developed, well-nourished female in  no acute distress.   PSYCHIATRIC: Normal mood and affect. Normal behavior. Normal judgment and thought content.  NEUROLGIC: Alert and oriented to person, place, and time. Normal muscle tone coordination. No cranial nerve deficit noted.  HENT:  Normocephalic, atraumatic, External right and left ear normal. Oropharynx is clear and moist  EYES: Conjunctivae and EOM are normal. Pupils are equal, round, and reactive to light. No scleral icterus.   NECK: Normal range of motion, supple, no masses.  Normal thyroid.   SKIN: Skin is warm and dry. No rash noted. Not diaphoretic. No erythema. No pallor.  CARDIOVASCULAR: Normal heart rate noted, regular  rhythm, no murmur.  RESPIRATORY: Clear to auscultation bilaterally.  Effort and breath sounds normal, no problems with respiration noted.  BREASTS: Symmetric in size. No masses, skin changes, nipple drainage, or lymphadenopathy.  ABDOMEN: Soft, normal bowel sounds, no distention noted.  No tenderness, rebound or guarding.   PELVIC:  External Genitalia: Normal  Vagina: Normal  Cervix: Normal, IUD strings present  Uterus: Normal  Adnexa: Normal  MUSCULOSKELETAL: Normal range of motion. No tenderness.  No cyanosis, clubbing, or edema.  2+ distal pulses.  LYMPHATIC: No Axillary, Supraclavicular, or Inguinal Adenopathy.  Assessment:   Annual gynecologic examination 27 y.o.   Contraception: IUD   Obesity 1   Problem List Items Addressed This Visit    None    Visit Diagnoses    Well woman exam    -  Primary   Relevant Orders   Amb ref to Medical Nutrition Therapy-MNT   CBC   TSH   Hemoglobin A1c   Comprehensive metabolic panel   BMI 32.0-32.9,adult       Relevant Orders   Amb ref to Medical Nutrition Therapy-MNT   TSH   Hemoglobin A1c     Plan:   Pap: Not needed.  Labs: See orders.   Routine preventative health maintenance measures emphasized: Exercise/Diet/Weight control, Tobacco Warnings, Alcohol/Substance use risks, Stress Management, Peer Pressure Issues and Safe Sex.  Referral to Medical Nutrition Therapy, see orders.   Reviewed red flag symptoms and when to call.   RTC x 1 year for Annual Exam or sooner if needed.   Vanessa DurhamJenkins Michelle Jenissa Tyrell,CNM [facility]

## 2017-03-21 NOTE — Patient Instructions (Signed)
Levonorgestrel intrauterine device (IUD) What is this medicine? LEVONORGESTREL IUD (LEE voe nor jes trel) is a contraceptive (birth control) device. The device is placed inside the uterus by a healthcare professional. It is used to prevent pregnancy. This device can also be used to treat heavy bleeding that occurs during your period. This medicine may be used for other purposes; ask your health care provider or pharmacist if you have questions. COMMON BRAND NAME(S): Minette Headland What should I tell my health care provider before I take this medicine? They need to know if you have any of these conditions: -abnormal Pap smear -cancer of the breast, uterus, or cervix -diabetes -endometritis -genital or pelvic infection now or in the past -have more than one sexual partner or your partner has more than one partner -heart disease -history of an ectopic or tubal pregnancy -immune system problems -IUD in place -liver disease or tumor -problems with blood clots or take blood-thinners -seizures -use intravenous drugs -uterus of unusual shape -vaginal bleeding that has not been explained -an unusual or allergic reaction to levonorgestrel, other hormones, silicone, or polyethylene, medicines, foods, dyes, or preservatives -pregnant or trying to get pregnant -breast-feeding How should I use this medicine? This device is placed inside the uterus by a health care professional. Talk to your pediatrician regarding the use of this medicine in children. Special care may be needed. Overdosage: If you think you have taken too much of this medicine contact a poison control center or emergency room at once. NOTE: This medicine is only for you. Do not share this medicine with others. What if I miss a dose? This does not apply. Depending on the brand of device you have inserted, the device will need to be replaced every 3 to 5 years if you wish to continue using this type of birth  control. What may interact with this medicine? Do not take this medicine with any of the following medications: -amprenavir -bosentan -fosamprenavir This medicine may also interact with the following medications: -aprepitant -armodafinil -barbiturate medicines for inducing sleep or treating seizures -bexarotene -boceprevir -griseofulvin -medicines to treat seizures like carbamazepine, ethotoin, felbamate, oxcarbazepine, phenytoin, topiramate -modafinil -pioglitazone -rifabutin -rifampin -rifapentine -some medicines to treat HIV infection like atazanavir, efavirenz, indinavir, lopinavir, nelfinavir, tipranavir, ritonavir -St. John's wort -warfarin This list may not describe all possible interactions. Give your health care provider a list of all the medicines, herbs, non-prescription drugs, or dietary supplements you use. Also tell them if you smoke, drink alcohol, or use illegal drugs. Some items may interact with your medicine. What should I watch for while using this medicine? Visit your doctor or health care professional for regular check ups. See your doctor if you or your partner has sexual contact with others, becomes HIV positive, or gets a sexual transmitted disease. This product does not protect you against HIV infection (AIDS) or other sexually transmitted diseases. You can check the placement of the IUD yourself by reaching up to the top of your vagina with clean fingers to feel the threads. Do not pull on the threads. It is a good habit to check placement after each menstrual period. Call your doctor right away if you feel more of the IUD than just the threads or if you cannot feel the threads at all. The IUD may come out by itself. You may become pregnant if the device comes out. If you notice that the IUD has come out use a backup birth control method like condoms and call your  health care provider. Using tampons will not change the position of the IUD and are okay to use  during your period. This IUD can be safely scanned with magnetic resonance imaging (MRI) only under specific conditions. Before you have an MRI, tell your healthcare provider that you have an IUD in place, and which type of IUD you have in place. What side effects may I notice from receiving this medicine? Side effects that you should report to your doctor or health care professional as soon as possible: -allergic reactions like skin rash, itching or hives, swelling of the face, lips, or tongue -fever, flu-like symptoms -genital sores -high blood pressure -no menstrual period for 6 weeks during use -pain, swelling, warmth in the leg -pelvic pain or tenderness -severe or sudden headache -signs of pregnancy -stomach cramping -sudden shortness of breath -trouble with balance, talking, or walking -unusual vaginal bleeding, discharge -yellowing of the eyes or skin Side effects that usually do not require medical attention (report to your doctor or health care professional if they continue or are bothersome): -acne -breast pain -change in sex drive or performance -changes in weight -cramping, dizziness, or faintness while the device is being inserted -headache -irregular menstrual bleeding within first 3 to 6 months of use -nausea This list may not describe all possible side effects. Call your doctor for medical advice about side effects. You may report side effects to FDA at 1-800-FDA-1088. Where should I keep my medicine? This does not apply. NOTE: This sheet is a summary. It may not cover all possible information. If you have questions about this medicine, talk to your doctor, pharmacist, or health care provider.  2018 Elsevier/Gold Standard (2015-11-07 14:14:56) Preventive Care 18-39 Years, Female Preventive care refers to lifestyle choices and visits with your health care provider that can promote health and wellness. What does preventive care include?  A yearly physical exam.  This is also called an annual well check.  Dental exams once or twice a year.  Routine eye exams. Ask your health care provider how often you should have your eyes checked.  Personal lifestyle choices, including: ? Daily care of your teeth and gums. ? Regular physical activity. ? Eating a healthy diet. ? Avoiding tobacco and drug use. ? Limiting alcohol use. ? Practicing safe sex. ? Taking vitamin and mineral supplements as recommended by your health care provider. What happens during an annual well check? The services and screenings done by your health care provider during your annual well check will depend on your age, overall health, lifestyle risk factors, and family history of disease. Counseling Your health care provider may ask you questions about your:  Alcohol use.  Tobacco use.  Drug use.  Emotional well-being.  Home and relationship well-being.  Sexual activity.  Eating habits.  Work and work environment.  Method of birth control.  Menstrual cycle.  Pregnancy history.  Screening You may have the following tests or measurements:  Height, weight, and BMI.  Diabetes screening. This is done by checking your blood sugar (glucose) after you have not eaten for a while (fasting).  Blood pressure.  Lipid and cholesterol levels. These may be checked every 5 years starting at age 20.  Skin check.  Hepatitis C blood test.  Hepatitis B blood test.  Sexually transmitted disease (STD) testing.  BRCA-related cancer screening. This may be done if you have a family history of breast, ovarian, tubal, or peritoneal cancers.  Pelvic exam and Pap test. This may be done every 3   years starting at age 14. Starting at age 51, this may be done every 5 years if you have a Pap test in combination with an HPV test.  Discuss your test results, treatment options, and if necessary, the need for more tests with your health care provider. Vaccines Your health care provider  may recommend certain vaccines, such as:  Influenza vaccine. This is recommended every year.  Tetanus, diphtheria, and acellular pertussis (Tdap, Td) vaccine. You may need a Td booster every 10 years.  Varicella vaccine. You may need this if you have not been vaccinated.  HPV vaccine. If you are 67 or younger, you may need three doses over 6 months.  Measles, mumps, and rubella (MMR) vaccine. You may need at least one dose of MMR. You may also need a second dose.  Pneumococcal 13-valent conjugate (PCV13) vaccine. You may need this if you have certain conditions and were not previously vaccinated.  Pneumococcal polysaccharide (PPSV23) vaccine. You may need one or two doses if you smoke cigarettes or if you have certain conditions.  Meningococcal vaccine. One dose is recommended if you are age 14-21 years and a first-year college student living in a residence hall, or if you have one of several medical conditions. You may also need additional booster doses.  Hepatitis A vaccine. You may need this if you have certain conditions or if you travel or work in places where you may be exposed to hepatitis A.  Hepatitis B vaccine. You may need this if you have certain conditions or if you travel or work in places where you may be exposed to hepatitis B.  Haemophilus influenzae type b (Hib) vaccine. You may need this if you have certain risk factors.  Talk to your health care provider about which screenings and vaccines you need and how often you need them. This information is not intended to replace advice given to you by your health care provider. Make sure you discuss any questions you have with your health care provider. Document Released: 03/23/2001 Document Revised: 10/15/2015 Document Reviewed: 11/26/2014 Elsevier Interactive Patient Education  [employer].

## 2017-03-27 DIAGNOSIS — Z975 Presence of (intrauterine) contraceptive device: Secondary | ICD-10-CM | POA: Insufficient documentation

## 2017-03-28 ENCOUNTER — Other Ambulatory Visit: Payer: BC Managed Care – PPO

## 2017-03-29 LAB — COMPREHENSIVE METABOLIC PANEL
A/G RATIO: 1.3 (ref 1.2–2.2)
ALBUMIN: 4.3 g/dL (ref 3.5–5.5)
ALT: 24 IU/L (ref 0–32)
AST: 20 IU/L (ref 0–40)
Alkaline Phosphatase: 38 IU/L — ABNORMAL LOW (ref 39–117)
BILIRUBIN TOTAL: 0.3 mg/dL (ref 0.0–1.2)
BUN/Creatinine Ratio: 9 (ref 9–23)
BUN: 6 mg/dL (ref 6–20)
CO2: 23 mmol/L (ref 20–29)
CREATININE: 0.68 mg/dL (ref 0.57–1.00)
Calcium: 9.4 mg/dL (ref 8.7–10.2)
Chloride: 104 mmol/L (ref 96–106)
GFR calc Af Amer: 142 mL/min/{1.73_m2} (ref >59)
GFR, EST NON AFRICAN AMERICAN: 123 mL/min/{1.73_m2} (ref >59)
GLOBULIN, TOTAL: 3.4 g/dL (ref 1.5–4.5)
GLUCOSE: 97 mg/dL (ref 65–99)
Potassium: 4.1 mmol/L (ref 3.5–5.2)
Sodium: 141 mmol/L (ref 134–144)
Total Protein: 7.7 g/dL (ref 6.0–8.5)

## 2017-03-29 LAB — HEMOGLOBIN A1C
Est. average glucose Bld gHb Est-mCnc: 103 mg/dL
Hgb A1c MFr Bld: 5.2 % (ref 4.8–5.6)

## 2017-03-29 LAB — CBC
Hematocrit: 40.8 % (ref 34.0–46.6)
Hemoglobin: 13.8 g/dL (ref 11.1–15.9)
MCH: 29 pg (ref 26.6–33.0)
MCHC: 33.8 g/dL (ref 31.5–35.7)
MCV: 86 fL (ref 79–97)
Platelets: 365 10*3/uL (ref 150–379)
RBC: 4.76 x10E6/uL (ref 3.77–5.28)
RDW: 13 % (ref 12.3–15.4)
WBC: 6.1 10*3/uL (ref 3.4–10.8)

## 2017-03-29 LAB — TSH: TSH: 1.56 u[IU]/mL (ref 0.450–4.500)

## 2017-04-08 ENCOUNTER — Encounter: Payer: Self-pay | Admitting: Dietician

## 2017-04-08 ENCOUNTER — Encounter: Payer: BC Managed Care – PPO | Attending: Certified Nurse Midwife | Admitting: Dietician

## 2017-04-08 VITALS — Ht 71.0 in | Wt 241.9 lb

## 2017-04-08 DIAGNOSIS — Z6833 Body mass index (BMI) 33.0-33.9, adult: Secondary | ICD-10-CM

## 2017-04-08 DIAGNOSIS — E6609 Other obesity due to excess calories: Secondary | ICD-10-CM

## 2017-04-08 NOTE — Patient Instructions (Addendum)
   Sources of protein: fish, eggs, seafood (shrimp, scallops, oysters), beans, whole grains like quinoa, dairy products, soy products (tofu, edamame), tempeh, legumes, hemp seeds, chia seeds, nuts & seeds, chickpeas  Salad dressings: oil-based not cream based, stick to serving size or two (usally 1-2Tbs)  Diets high in protein, fiber (veggies, fruits), and some healthy fats help to keep you full and satisfied  Try to eat a more satisfying, wholesome breakfast to help control hunger throughout the day  Healthy snack options- see sheet  Frozen meals: less than 600mg  of sodium per serving

## 2017-04-08 NOTE — Progress Notes (Signed)
Medical Nutrition Therapy: Visit start time: 1330  end time: 1430  Assessment:  Diagnosis: obesity Past medical history: Vitamin D deficiency, see chart Psychosocial issues/ stress concerns: none Preferred learning method:  . Auditory . Hands-on  Current weight: 241.9lb  Height: 5\' 11"  Medications, supplements: IUD, Zofran prn  Progress and evaluation: Patient is struggling to lose weight again after starting birth control. Reports a wt of 206# last May post active weight loss attempt, which was slowly regained when she began using an IUD. C/o increased hunger at this time as well. She had started a pescatarian diet when initially trying to lose weight which she has stuck with. This has allowed her to resist the temptation of eating at fast food locations. She does, however, enjoy going out to eat at restaurants with her boyfriend or family often. Now that she is no longer in school she is making it a goal to eat at home more often. For protein she mainly eats eggs and seafood, along with some dairy products. Does not have these protein sources daily and is unaware of what foods contain protein. Reports to focus more on intake of vegetables now that she is following a pescatarian diet but snacks frequently to make up for frequent hunger, often on processed foods. Does not eat breakfast regularly but plans to make this more of a habit now that she does not need to wake up for class. Does like to cook.  Physical activity: none at this time  Dietary Intake:  Usual eating pattern includes 2-3 meals and 2-4 snacks per day. Dining out frequency: 6-7 meals per week.  Breakfast: fruit or fruit gummies, eggs, pancakes 1x/wk Snack: n/a Lunch: salad + soup, variety of frozen vegetables, rice, pasta 1x/wk, grilled cheese, other sandwiches Snack: goldfish, fruit snacks, honey wheat toast, angel food cake, veggie corndog, a soft drink Supper: similar to lunch Snack: similar options to prior Beverages:  water, cranberry or orange juice occasionally, smoothies rarely, coffee with creamer or Starbucks espresso beverages  Nutrition Care Education: Topics covered: portion control, dietary sources of protein, tips for eating out, types of fats, smart snacking, foods that increase satiety and fullness, guidelines when choosing frozen meals/ vegetables, food labeling and marketing; false advertising  Basic nutrition: basic food groups, appropriate nutrient balance, appropriate meal and snack schedule, general nutrition guidelines    Weight control: benefits of weight control, determining reasonable weight goal, behavioral changes for weight loss Advanced nutrition: cooking techniques, dining out, food label reading Other lifestyle changes:  benefits of making changes, increasing motivation, readiness for change, identifying habits that need to change  Nutritional Diagnosis:  [place]-3.3 Overweight/obesity As related to lifestyle habits.  As evidenced by BMI 33.74. [id number] Inadequate protein intake As related to dietary choices.  As evidenced by patient report of not knowing what foods contain protein and choosing to follow a pescatarian diet.  Intervention: Discussion as noted above. Patient will work on including more nutrient-dense snack options and more sources of protein, heart healthy fats and dietary fiber at meal times to help improve satiety and feelings of fullness throughout the day. She will also aim to include more sources of protein into her daily diet, as she is a pescatarian. Portion control and choosing more nutritious foods / smaller portions when eating out will also be areas of focus.  Education Materials given:  . Smart and [employer] Snacking . Healthy Eating when Eating Out . Goals/ instructions  Learner/ who was taught:  . Patient   Level  of understanding: Marland Kitchen Verbalizes/ demonstrates competency  Demonstrated degree of understanding via:   Teach back Learning  barriers: . None  Willingness to learn/ readiness for change: . Eager, change in progress  Monitoring and Evaluation:  Dietary intake, exercise, and body weight      follow up: in 6 month(s): September 6th

## 2017-05-13 ENCOUNTER — Encounter: Payer: Self-pay | Admitting: Certified Nurse Midwife

## 2017-05-13 ENCOUNTER — Ambulatory Visit (INDEPENDENT_AMBULATORY_CARE_PROVIDER_SITE_OTHER): Payer: BC Managed Care – PPO | Admitting: Certified Nurse Midwife

## 2017-05-13 ENCOUNTER — Ambulatory Visit (INDEPENDENT_AMBULATORY_CARE_PROVIDER_SITE_OTHER): Payer: BC Managed Care – PPO

## 2017-05-13 ENCOUNTER — Other Ambulatory Visit: Payer: Self-pay | Admitting: Certified Nurse Midwife

## 2017-05-13 VITALS — BP 77/50 | HR 75 | Ht 71.0 in | Wt 232.2 lb

## 2017-05-13 DIAGNOSIS — R109 Unspecified abdominal pain: Secondary | ICD-10-CM | POA: Diagnosis not present

## 2017-05-13 DIAGNOSIS — Z30431 Encounter for routine checking of intrauterine contraceptive device: Secondary | ICD-10-CM

## 2017-05-13 NOTE — Progress Notes (Signed)
Pt is here with c/o cramping every single day since 05/08/17. Symptoms first appeared after intercourse.

## 2017-05-13 NOTE — Patient Instructions (Signed)

## 2017-05-15 MED ORDER — IBUPROFEN 600 MG PO TABS: 600.0000 mg | ORAL_TABLET | Freq: Three times a day (TID) | ORAL | 1 refills | Status: AC | PRN

## 2017-05-15 NOTE — Progress Notes (Signed)
GYN ENCOUNTER NOTE  Subjective:       Kristina Woodard is a 27 y.o. G0P0000 female here for gynecologic evaluation of the following issues:   Reports intermittent lower abdominal cramping daily since 05/08/2017. Symptoms first appeared after intercourse.   Endorses relief with Tylenol. Pain increases with movement. Questions IUD placement. Able to feel strings.   Denies difficulty breathing or respiratory distress, chest pain, vaginal bleeding, dysuria, and leg pain or swelling.   Gynecologic History  No LMP recorded. (Menstrual status: IUD).  Contraception: IUD, Rutha BouchardKyleena  Last Pap: 02/2016. Results were: normal  Obstetric History  OB History  Gravida Para Term Preterm AB Living  0 0 0 0 0 0  SAB TAB Ectopic Multiple Live Births  0 0 0 0 0    Past Medical History:  Diagnosis Date  . Painful menstrual periods     Past Surgical History:  Procedure Laterality Date  . NO PAST SURGERIES      Current Outpatient Medications on File Prior to Visit  Medication Sig Dispense Refill  . augmented betamethasone dipropionate (DIPROLENE-AF) 0.05 % cream APPLY TO AFFECTED AREA TWICE DAILY UNTIL IMPROVED  1  . Levonorgestrel (KYLEENA) 19.5 MG IUD 1 Device by Intrauterine route once. Inserted 03/26/16    . ondansetron (ZOFRAN-ODT) 4 MG disintegrating tablet      No current facility-administered medications on file prior to visit.     Allergies  Allergen Reactions  . Benadryl [Diphenhydramine]     Social History   Socioeconomic History  . Marital status: Single    Spouse name: Not on file  . Number of children: Not on file  . Years of education: Not on file  . Highest education level: Not on file  Occupational History  . Not on file  Social Needs  . Financial resource strain: Not on file  . Food insecurity:    Worry: Not on file    Inability: Not on file  . Transportation needs:    Medical: Not on file    Non-medical: Not on file  Tobacco Use  . Smoking status: Never  Smoker  . Smokeless tobacco: Never Used  Substance and Sexual Activity  . Alcohol use: Yes  . Drug use: No  . Sexual activity: Yes    Birth control/protection: IUD    Comment: kyleena  Lifestyle  . Physical activity:    Days per week: Not on file    Minutes per session: Not on file  . Stress: Not on file  Relationships  . Social connections:    Talks on phone: Not on file    Gets together: Not on file    Attends religious service: Not on file    Active member of club or organization: Not on file    Attends meetings of clubs or organizations: Not on file    Relationship status: Not on file  . Intimate partner violence:    Fear of current or ex partner: Not on file    Emotionally abused: Not on file    Physically abused: Not on file    Forced sexual activity: Not on file  Other Topics Concern  . Not on file  Social History Narrative  . Not on file    Family History  Problem Relation Age of Onset  . Cancer Paternal Aunt        ovarian  . Ovarian cancer Paternal Aunt   . Cancer Maternal Grandmother        melanoma ,  metastatic  colon Ca  . Ovarian cancer Paternal Grandmother     The following portions of the patient's history were reviewed and updated as appropriate: allergies, current medications, past family history, past medical history, past social history, past surgical history and problem list.  Review of Systems  Review of Systems - Negative except as noted above. History obtained from the patient  Objective:   BP (!) 77/50   Pulse 75   Ht 5\' 11"  (1.803 m)   Wt 232 lb 3 oz (105.3 kg)   BMI 32.38 kg/m   CONSTITUTIONAL: Well-developed, well-nourished female in no acute distress.   ULTRASOUND REPORT  Location: [facility] Date of Service:  05/13/2017   Indications: IUD Check; Pain Findings:  The uterus measures 7.4 x 4.3 x 3.4 cm. Echo texture is homogeneous without evidence of focal masses. The Endometrium measures 3.6 mm. The IUD  appears to be in the correct location within the endometrium.  Right Ovary measures 2.9 x 1.9 x 2.1 cm. It is normal in appearance. Left Ovary measures 3.2 x 2.6 x 2.6 cm. It is normal appearance. Survey of the adnexa demonstrates no adnexal masses. There is no free fluid in the cul de sac.  Impression: 1. Anteverted uterus appears of normal size and contour. 2. The endometrium measures 3.6 mm. 3. Bilateral ovaries appear WNL. 4. The IUD appears to be in the correct location within the endometrium.  Recommendations: 1.Clinical correlation with the patient's History and Physical Exam.   Assessment:   1. IUD check up  2. Abdominal cramping  Plan:   Normal IUD check.   Discussed home treatment measures.   Rx: Motrin, see orders.   Reviewed red flag symptoms and when to call.   RTC as needed.    Gunnar Bulla, CNM [facility], [facility]

## 2017-10-14 ENCOUNTER — Ambulatory Visit: Payer: BC Managed Care – PPO | Admitting: Dietician

## 2018-03-23 ENCOUNTER — Encounter: Payer: Self-pay | Admitting: Certified Nurse Midwife

## 2018-03-23 ENCOUNTER — Ambulatory Visit (INDEPENDENT_AMBULATORY_CARE_PROVIDER_SITE_OTHER): Payer: BC Managed Care – PPO | Admitting: Certified Nurse Midwife

## 2018-03-23 VITALS — BP 86/60 | HR 90 | Ht 71.0 in | Wt 245.5 lb

## 2018-03-23 DIAGNOSIS — Z6834 Body mass index (BMI) 34.0-34.9, adult: Secondary | ICD-10-CM

## 2018-03-23 DIAGNOSIS — Z975 Presence of (intrauterine) contraceptive device: Secondary | ICD-10-CM

## 2018-03-23 DIAGNOSIS — Z8639 Personal history of other endocrine, nutritional and metabolic disease: Secondary | ICD-10-CM | POA: Diagnosis not present

## 2018-03-23 DIAGNOSIS — Z01419 Encounter for gynecological examination (general) (routine) without abnormal findings: Secondary | ICD-10-CM | POA: Diagnosis not present

## 2018-03-23 DIAGNOSIS — E781 Pure hyperglyceridemia: Secondary | ICD-10-CM

## 2018-03-23 NOTE — Progress Notes (Signed)
ANNUAL PREVENTATIVE CARE GYN  ENCOUNTER NOTE  Subjective:       Kristina Woodard is a 28 y.o. G0P0000 female here for a routine annual gynecologic exam.     Reports weight gain in the last month. Attributes the weight gain to the loss of her father. Reports seeing psychiatry for grief and medication management.   Reports a decrease in sexual drive, nausea, and transient abdominal cramping. Denies vaginal dryness. Denies need for medication or removal of IUD.   Denies difficulty breathing or respiratory distress, chest pain, abdominal pain, constipation/diarrhea, vaginal bleeding, dysuria, and leg pain or swelling.   Hx significant for recent mole removal. Followed by Dermatology.   Gynecologic History  No LMP recorded. (Menstrual status: IUD).   Contraception: IUD [place]  Last Pap: 02/2016. Results were: normal   Obstetric History OB History  Gravida Para Term Preterm AB Living  0 0 0 0 0 0  SAB TAB Ectopic Multiple Live Births  0 0 0 0 0    Past Medical History:  Diagnosis Date  . Painful menstrual periods     Past Surgical History:  Procedure Laterality Date  . NO PAST SURGERIES    . SKIN BIOPSY      Current Outpatient Medications on File Prior to Visit  Medication Sig Dispense Refill  . augmented betamethasone dipropionate (DIPROLENE-AF) 0.05 % cream APPLY TO AFFECTED AREA TWICE DAILY UNTIL IMPROVED  1  . cetirizine (ZYRTEC) 10 MG tablet Take 10 mg by mouth daily.    Marland Kitchen. escitalopram (LEXAPRO) 10 MG tablet     . ibuprofen (ADVIL,MOTRIN) 600 MG tablet Take 1 tablet (600 mg total) by mouth every 8 (eight) hours as needed for cramping. 30 tablet 1  . Levonorgestrel (KYLEENA) 19.5 MG IUD 1 Device by Intrauterine route once. Inserted 03/26/16    . methylphenidate 18 MG PO CR tablet     . traZODone (DESYREL) 50 MG tablet      No current facility-administered medications on file prior to visit.     Allergies  Allergen Reactions  . Benadryl [Diphenhydramine]      Social History   Socioeconomic History  . Marital status: Single    Spouse name: Not on file  . Number of children: Not on file  . Years of education: Not on file  . Highest education level: Not on file  Occupational History  . Not on file  Social Needs  . Financial resource strain: Not on file  . Food insecurity:    Worry: Not on file    Inability: Not on file  . Transportation needs:    Medical: Not on file    Non-medical: Not on file  Tobacco Use  . Smoking status: Never Smoker  . Smokeless tobacco: Never Used  Substance and Sexual Activity  . Alcohol use: Yes  . Drug use: No  . Sexual activity: Yes    Birth control/protection: I.U.D.    Comment: kyleena  Lifestyle  . Physical activity:    Days per week: Not on file    Minutes per session: Not on file  . Stress: Not on file  Relationships  . Social connections:    Talks on phone: Not on file    Gets together: Not on file    Attends religious service: Not on file    Active member of club or organization: Not on file    Attends meetings of clubs or organizations: Not on file    Relationship status: Not on file  .  Intimate partner violence:    Fear of current or ex partner: Not on file    Emotionally abused: Not on file    Physically abused: Not on file    Forced sexual activity: Not on file  Other Topics Concern  . Not on file  Social History Narrative  . Not on file    Family History  Problem Relation Age of Onset  . Cancer Paternal Aunt        ovarian  . Ovarian cancer Paternal Aunt   . Cancer Maternal Grandmother        melanoma ,  metastatic colon Ca  . Ovarian cancer Paternal Grandmother     The following portions of the patient's history were reviewed and updated as appropriate: allergies, current medications, past family history, past medical history, past social history, past surgical history and problem list.  Review of Systems  ROS negative except as noted above. Information obtained  from the patient   Objective:   BP (!) 86/60   Pulse 90   Ht 5\' 11"  (1.803 m)   Wt 245 lb 8 oz (111.4 kg)   BMI 34.24 kg/m    CONSTITUTIONAL: Well-developed, well-nourished female in no acute distress.   PSYCHIATRIC: Normal mood and affect. Normal behavior. Normal judgment and thought content.  NEUROLGIC: Alert and oriented to person, place, and time. Normal muscle tone coordination. No cranial nerve deficit noted.  HENT:  Normocephalic, atraumatic, External right and left ear normal. Oropharynx is clear and moist  EYES: Conjunctivae and EOM are normal. Pupils are equal, round, and reactive to light. No scleral icterus.   NECK: Normal range of motion, supple. Mass palpated, unsure if thyroid enlargement of body habitus.   SKIN: Skin is warm and dry. No rash noted. Not diaphoretic. No erythema. No pallor.  CARDIOVASCULAR: Normal heart rate noted, regular rhythm, no murmur.  RESPIRATORY: Clear to auscultation bilaterally. Effort and breath sounds normal, no problems with respiration noted.  BREASTS: Symmetric in size. Fibrocystic breast changes noted. No skin changes, nipple drainage, or lymphadenopathy.  ABDOMEN: Soft, normal bowel sounds, no distention noted.  No tenderness, rebound or guarding.   PELVIC:  External Genitalia: Normal  Vagina: Normal  Cervix: IUD strings visualized   MUSCULOSKELETAL: Normal range of motion. No tenderness.  No cyanosis, clubbing, or edema.  2+ distal pulses.  LYMPHATIC: No Axillary, Supraclavicular, or Inguinal Adenopathy.    Assessment:   Annual gynecologic examination 28 y.o.   Contraception: IUD   Obesity 1   Problem List Items Addressed This Visit      Other   IUD (intrauterine device) in place    Other Visit Diagnoses    Well woman exam    -  Primary   Relevant Orders   CBC   Thyroid Panel With TSH   Lipid panel   Hemoglobin A1c   VITAMIN D 25 Hydroxy (Vit-D Deficiency, Fractures)   History of vitamin D deficiency        Relevant Orders   VITAMIN D 25 Hydroxy (Vit-D Deficiency, Fractures)   High triglycerides       Relevant Orders   Lipid panel   BMI 34.0-34.9,adult       Relevant Orders   Thyroid Panel With TSH   Lipid panel   Hemoglobin A1c      Plan:   Pap: Not needed  Labs: See orders   Routine preventative health maintenance measures emphasized: Exercise/Diet/Weight control, Tobacco Warnings, Alcohol/Substance use risks, Stress Management, Peer Pressure Issues and  Safe Sex  Reviewed red flag symptoms and when to call.   RTC fasting for labs.   Darcella Cheshire, RN  [facility] NP Student

## 2018-03-23 NOTE — Patient Instructions (Addendum)
WE WOULD LOVE TO HEAR FROM YOU!!!!   Thank you Kennon Portela for visiting [facility].  Providing our patients with the best experience possible is really important to [place], and we hope that you felt that on your recent visit. The most valuable feedback we get comes from [place]!!    If you receive a survey please take a couple of minutes to let us know how we did.Thank you for continuing to trust [place] with your care.   [facility]   Levonorgestrel intrauterine device (IUD) What is this medicine? LEVONORGESTREL IUD (LEE voe nor jes trel) is a contraceptive (birth control) device. The device is placed inside the uterus by a healthcare professional. It is used to prevent pregnancy. This device can also be used to treat heavy bleeding that occurs during your period. This medicine may be used for other purposes; ask your health care provider or pharmacist if you have questions. COMMON BRAND NAME(S): Minette Headland What should I tell my health care provider before I take this medicine? They need to know if you have any of these conditions: -abnormal Pap smear -cancer of the breast, uterus, or cervix -diabetes -endometritis -genital or pelvic infection now or in the past -have more than one sexual partner or your partner has more than one partner -heart disease -history of an ectopic or tubal pregnancy -immune system problems -IUD in place -liver disease or tumor -problems with blood clots or take blood-thinners -seizures -use intravenous drugs -uterus of unusual shape -vaginal bleeding that has not been explained -an unusual or allergic reaction to levonorgestrel, other hormones, silicone, or polyethylene, medicines, foods, dyes, or preservatives -pregnant or trying to get pregnant -breast-feeding How should I use this medicine? This device is placed inside the uterus by a health care professional. Talk to your pediatrician regarding  the use of this medicine in children. Special care may be needed. Overdosage: If you think you have taken too much of this medicine contact a poison control center or emergency room at once. NOTE: This medicine is only for you. Do not share this medicine with others. What if I miss a dose? This does not apply. Depending on the brand of device you have inserted, the device will need to be replaced every 3 to 5 years if you wish to continue using this type of birth control. What may interact with this medicine? Do not take this medicine with any of the following medications: -amprenavir -bosentan -fosamprenavir This medicine may also interact with the following medications: -aprepitant -armodafinil -barbiturate medicines for inducing sleep or treating seizures -bexarotene -boceprevir -griseofulvin -medicines to treat seizures like carbamazepine, ethotoin, felbamate, oxcarbazepine, phenytoin, topiramate -modafinil -pioglitazone -rifabutin -rifampin -rifapentine -some medicines to treat HIV infection like atazanavir, efavirenz, indinavir, lopinavir, nelfinavir, tipranavir, ritonavir -St. John's wort -warfarin This list may not describe all possible interactions. Give your health care provider a list of all the medicines, herbs, non-prescription drugs, or dietary supplements you use. Also tell them if you smoke, drink alcohol, or use illegal drugs. Some items may interact with your medicine. What should I watch for while using this medicine? Visit your doctor or health care professional for regular check ups. See your doctor if you or your partner has sexual contact with others, becomes HIV positive, or gets a sexual transmitted disease. This product does not protect you against HIV infection (AIDS) or other sexually transmitted diseases. You can check the placement of the IUD yourself by reaching up to the top  of your vagina with clean fingers to feel the threads. Do not pull on the  threads. It is a good habit to check placement after each menstrual period. Call your doctor right away if you feel more of the IUD than just the threads or if you cannot feel the threads at all. The IUD may come out by itself. You may become pregnant if the device comes out. If you notice that the IUD has come out use a backup birth control method like condoms and call your health care provider. Using tampons will not change the position of the IUD and are okay to use during your period. This IUD can be safely scanned with magnetic resonance imaging (MRI) only under specific conditions. Before you have an MRI, tell your healthcare provider that you have an IUD in place, and which type of IUD you have in place. What side effects may I notice from receiving this medicine? Side effects that you should report to your doctor or health care professional as soon as possible: -allergic reactions like skin rash, itching or hives, swelling of the face, lips, or tongue -fever, flu-like symptoms -genital sores -high blood pressure -no menstrual period for 6 weeks during use -pain, swelling, warmth in the leg -pelvic pain or tenderness -severe or sudden headache -signs of pregnancy -stomach cramping -sudden shortness of breath -trouble with balance, talking, or walking -unusual vaginal bleeding, discharge -yellowing of the eyes or skin Side effects that usually do not require medical attention (report to your doctor or health care professional if they continue or are bothersome): -acne -breast pain -change in sex drive or performance -changes in weight -cramping, dizziness, or faintness while the device is being inserted -headache -irregular menstrual bleeding within first 3 to 6 months of use -nausea This list may not describe all possible side effects. Call your doctor for medical advice about side effects. You may report side effects to FDA at 1-800-FDA-1088. Where should I keep my  medicine? This does not apply. NOTE: This sheet is a summary. It may not cover all possible information. If you have questions about this medicine, talk to your doctor, pharmacist, or health care provider.  2019 Elsevier/Gold Standard (2015-11-07 14:14:56) Preventive Care 18-39 Years, Female Preventive care refers to lifestyle choices and visits with your health care provider that can promote health and wellness. What does preventive care include?   A yearly physical exam. This is also called an annual well check.  Dental exams once or twice a year.  Routine eye exams. Ask your health care provider how often you should have your eyes checked.  Personal lifestyle choices, including: ? Daily care of your teeth and gums. ? Regular physical activity. ? Eating a healthy diet. ? Avoiding tobacco and drug use. ? Limiting alcohol use. ? Practicing safe sex. ? Taking vitamin and mineral supplements as recommended by your health care provider. What happens during an annual well check? The services and screenings done by your health care provider during your annual well check will depend on your age, overall health, lifestyle risk factors, and family history of disease. Counseling Your health care provider may ask you questions about your:  Alcohol use.  Tobacco use.  Drug use.  Emotional well-being.  Home and relationship well-being.  Sexual activity.  Eating habits.  Work and work Statistician.  Method of birth control.  Menstrual cycle.  Pregnancy history. Screening You may have the following tests or measurements:  Height, weight, and BMI.  Diabetes screening.  This is done by checking your blood sugar (glucose) after you have not eaten for a while (fasting).  Blood pressure.  Lipid and cholesterol levels. These may be checked every 5 years starting at age 66.  Skin check.  Hepatitis C blood test.  Hepatitis B blood test.  Sexually transmitted disease (STD)  testing.  BRCA-related cancer screening. This may be done if you have a family history of breast, ovarian, tubal, or peritoneal cancers.  Pelvic exam and Pap test. This may be done every 3 years starting at age 17. Starting at age 28, this may be done every 5 years if you have a Pap test in combination with an HPV test. Discuss your test results, treatment options, and if necessary, the need for more tests with your health care provider. Vaccines Your health care provider may recommend certain vaccines, such as:  Influenza vaccine. This is recommended every year.  Tetanus, diphtheria, and acellular pertussis (Tdap, Td) vaccine. You may need a Td booster every 10 years.  Varicella vaccine. You may need this if you have not been vaccinated.  HPV vaccine. If you are 108 or younger, you may need three doses over 6 months.  Measles, mumps, and rubella (MMR) vaccine. You may need at least one dose of MMR. You may also need a second dose.  Pneumococcal 13-valent conjugate (PCV13) vaccine. You may need this if you have certain conditions and were not previously vaccinated.  Pneumococcal polysaccharide (PPSV23) vaccine. You may need one or two doses if you smoke cigarettes or if you have certain conditions.  Meningococcal vaccine. One dose is recommended if you are age 81-21 years and a first-year college student living in a residence hall, or if you have one of several medical conditions. You may also need additional booster doses.  Hepatitis A vaccine. You may need this if you have certain conditions or if you travel or work in places where you may be exposed to hepatitis A.  Hepatitis B vaccine. You may need this if you have certain conditions or if you travel or work in places where you may be exposed to hepatitis B.  Haemophilus influenzae type b (Hib) vaccine. You may need this if you have certain risk factors. Talk to your health care provider about which screenings and vaccines you need  and how often you need them. This information is not intended to replace advice given to you by your health care provider. Make sure you discuss any questions you have with your health care provider. Document Released: 03/23/2001 Document Revised: 09/07/2016 Document Reviewed: 11/26/2014 Elsevier Interactive Patient Education  2019 [employer].

## 2018-03-30 ENCOUNTER — Other Ambulatory Visit: Payer: BC Managed Care – PPO

## 2018-04-05 ENCOUNTER — Other Ambulatory Visit: Payer: BC Managed Care – PPO

## 2018-04-06 ENCOUNTER — Other Ambulatory Visit: Payer: BC Managed Care – PPO

## 2018-04-06 ENCOUNTER — Other Ambulatory Visit: Payer: Self-pay | Admitting: Certified Nurse Midwife

## 2018-04-06 LAB — CBC
Hematocrit: 41.9 % (ref 34.0–46.6)
Hemoglobin: 14.1 g/dL (ref 11.1–15.9)
MCH: 28.5 pg (ref 26.6–33.0)
MCHC: 33.7 g/dL (ref 31.5–35.7)
MCV: 85 fL (ref 79–97)
Platelets: 416 10*3/uL (ref 150–450)
RBC: 4.95 x10E6/uL (ref 3.77–5.28)
RDW: 12.4 % (ref 11.7–15.4)
WBC: 6.9 10*3/uL (ref 3.4–10.8)

## 2018-04-06 LAB — LIPID PANEL
Chol/HDL Ratio: 3.4 ratio (ref 0.0–4.4)
Cholesterol, Total: 176 mg/dL (ref 100–199)
HDL: 52 mg/dL (ref >39)
LDL Calculated: 109 mg/dL — ABNORMAL HIGH (ref 0–99)
Triglycerides: 77 mg/dL (ref 0–149)
VLDL Cholesterol Cal: 15 mg/dL (ref 5–40)

## 2018-04-06 LAB — THYROID PANEL WITH TSH
Free Thyroxine Index: 1.8 (ref 1.2–4.9)
T3 Uptake Ratio: 26 % (ref 24–39)
T4, Total: 6.9 ug/dL (ref 4.5–12.0)
TSH: 3.49 u[IU]/mL (ref 0.450–4.500)

## 2018-04-06 LAB — VITAMIN D 25 HYDROXY (VIT D DEFICIENCY, FRACTURES): Vit D, 25-Hydroxy: 19.4 ng/mL — ABNORMAL LOW (ref 30.0–100.0)

## 2018-04-06 LAB — HEMOGLOBIN A1C
Est. average glucose Bld gHb Est-mCnc: 111 mg/dL
Hgb A1c MFr Bld: 5.5 % (ref 4.8–5.6)

## 2018-04-06 MED ORDER — VITAMIN D (ERGOCALCIFEROL) 1.25 MG (50000 UNIT) PO CAPS: 50000.0000 [IU] | ORAL_CAPSULE | ORAL | 2 refills | Status: DC

## 2018-05-15 ENCOUNTER — Encounter: Payer: Self-pay | Admitting: *Deleted

## 2019-01-08 ENCOUNTER — Ambulatory Visit: Payer: BC Managed Care – PPO | Admitting: Internal Medicine

## 2019-03-31 NOTE — Progress Notes (Signed)
ANNUAL PREVENTATIVE CARE GYN  ENCOUNTER NOTE  Subjective:       Kristina Woodard is a 29 y.o. G0P0000 female here for a routine annual gynecologic exam.  Current complaints: 1. Low AMH on home Modern Fertility testing, questions fertility 2. Needs Pap smear  Denies difficulty breathing or respiratory distress, chest pain, abdominal pain, excessive vaginal bleeding, dysuria, and leg pain or swelling.    Gynecologic History  No LMP recorded. (Menstrual status: IUD).  Contraception: IUD, Rutha Bouchard   Last Pap: 2018. Results were: normal  Obstetric History  OB History  Gravida Para Term Preterm AB Living  0 0 0 0 0 0  SAB TAB Ectopic Multiple Live Births  0 0 0 0 0    Past Medical History:  Diagnosis Date  . Painful menstrual periods     Past Surgical History:  Procedure Laterality Date  . NO PAST SURGERIES    . SKIN BIOPSY      Current Outpatient Medications on File Prior to Visit  Medication Sig Dispense Refill  . augmented betamethasone dipropionate (DIPROLENE-AF) 0.05 % cream APPLY TO AFFECTED AREA TWICE DAILY UNTIL IMPROVED  1  . cetirizine (ZYRTEC) 10 MG tablet Take 10 mg by mouth daily.    Marland Kitchen escitalopram (LEXAPRO) 10 MG tablet     . ibuprofen (ADVIL,MOTRIN) 600 MG tablet Take 1 tablet (600 mg total) by mouth every 8 (eight) hours as needed for cramping. 30 tablet 1  . Levonorgestrel (KYLEENA) 19.5 MG IUD 1 Device by Intrauterine route once. Inserted 03/26/16    . methylphenidate 18 MG PO CR tablet     . traZODone (DESYREL) 50 MG tablet     . Vitamin D, Ergocalciferol, (DRISDOL) 1.25 MG (50000 UT) CAPS capsule Take 1 capsule (50,000 Units total) by mouth every 7 (seven) days. 24 capsule 2   No current facility-administered medications on file prior to visit.    Allergies  Allergen Reactions  . Benadryl [Diphenhydramine]     Social History   Socioeconomic History  . Marital status: Single    Spouse name: Not on file  . Number of children: Not on file  . Years  of education: Not on file  . Highest education level: Not on file  Occupational History  . Not on file  Tobacco Use  . Smoking status: Never Smoker  . Smokeless tobacco: Never Used  Substance and Sexual Activity  . Alcohol use: Yes    Comment: 1-2 x per month  . Drug use: No  . Sexual activity: Yes    Birth control/protection: I.U.D.    Comment: kyleena  Other Topics Concern  . Not on file  Social History Narrative  . Not on file   Social Determinants of Health   Financial Resource Strain:   . Difficulty of Paying Living Expenses: Not on file  Food Insecurity:   . Worried About Programme researcher, broadcasting/film/video in the Last Year: Not on file  . Ran Out of Food in the Last Year: Not on file  Transportation Needs:   . Lack of Transportation (Medical): Not on file  . Lack of Transportation (Non-Medical): Not on file  Physical Activity:   . Days of Exercise per Week: Not on file  . Minutes of Exercise per Session: Not on file  Stress:   . Feeling of Stress : Not on file  Social Connections:   . Frequency of Communication with Friends and Family: Not on file  . Frequency of Social Gatherings with Friends and  Family: Not on file  . Attends Religious Services: Not on file  . Active Member of Clubs or Organizations: Not on file  . Attends Archivist Meetings: Not on file  . Marital Status: Not on file  Intimate Partner Violence:   . Fear of Current or Ex-Partner: Not on file  . Emotionally Abused: Not on file  . Physically Abused: Not on file  . Sexually Abused: Not on file    Family History  Problem Relation Age of Onset  . Cancer Paternal Aunt        ovarian  . Ovarian cancer Paternal Aunt   . Cancer Maternal Grandmother        melanoma ,  metastatic colon Ca  . Ovarian cancer Paternal Grandmother     The following portions of the patient's history were reviewed and updated as appropriate: allergies, current medications, past family history, past medical history, past  social history, past surgical history and problem list.  Review of Systems  ROS negative except as noted above. Information obtained from patient.    Objective:   BP 111/79   Pulse (!) 115   Ht 5\' 11"  (1.803 m)   Wt 230 lb 8 oz (104.6 kg)   LMP  (LMP Unknown)   BMI 32.15 kg/m    CONSTITUTIONAL: Well-developed, well-nourished female in no acute distress.   PSYCHIATRIC: Normal mood and affect. Normal behavior. Normal judgment and thought content.  Center: Alert and oriented to person, place, and time. Normal muscle tone coordination. No cranial nerve deficit noted.  HENT:  Normocephalic, atraumatic, External right and left ear normal.   EYES: Conjunctivae and EOM are normal. Pupils are equal and round.   NECK: Normal range of motion, supple, no masses.  Normal thyroid.   SKIN: Skin is warm and dry. No rash noted. Not diaphoretic. No erythema. No pallor. Tattoos present.   CARDIOVASCULAR: Normal heart rate noted, regular rhythm, no murmur.  RESPIRATORY: Clear to auscultation bilaterally. Effort and breath sounds normal, no problems with respiration noted.  BREASTS: Symmetric in size. No masses, skin changes, nipple drainage, or lymphadenopathy.  ABDOMEN: Soft, normal bowel sounds, no distention noted.  No tenderness, rebound or guarding.   PELVIC:  External Genitalia: Normal  Vagina: Normal  Cervix: Normal, IUD strings present  Uterus: Normal  Adnexa: Normal   MUSCULOSKELETAL: Normal range of motion. No tenderness.  No cyanosis, clubbing, or edema.  2+ distal pulses.  LYMPHATIC: No Axillary, Supraclavicular, or Inguinal Adenopathy.  Assessment:   Annual gynecologic examination 29 y.o.   Contraception: IUD, Kyleena   Obesity 1   Problem List Items Addressed This Visit      Other   IUD (intrauterine device) in place    Other Visit Diagnoses    Well woman exam    -  Primary   Relevant Orders   Cytology - PAP   VITAMIN D 25 Hydroxy (Vit-D Deficiency,  Fractures)   Hemoglobin A1c   CBC   Lipid panel   Thyroid Panel With TSH   Encounter for fertility planning       Relevant Orders   VITAMIN D 25 Hydroxy (Vit-D Deficiency, Fractures)   Thyroid Panel With TSH   Screening for cervical cancer       Relevant Orders   Cytology - PAP   History of vitamin D deficiency       BMI 32.0-32.9,adult          Plan:   Pap: Pap, Reflex if ASCUS  Labs: See orders   Routine preventative health maintenance measures emphasized: Exercise/Diet/Weight control, Tobacco Warnings, Alcohol/Substance use risks and Stress Management; see AVS  Discussed labs results and preparing for pregnancy  Reviewed red flag symptoms and when to call  RTC x 1 year for ANNUAL EXAM or sooner if needed   Gunnar Bulla, CNM [facility], [facility] 03/31/19 12:27 AM

## 2019-04-02 ENCOUNTER — Other Ambulatory Visit: Payer: Self-pay

## 2019-04-02 ENCOUNTER — Encounter: Payer: Self-pay | Admitting: Certified Nurse Midwife

## 2019-04-02 ENCOUNTER — Ambulatory Visit: Payer: Self-pay | Admitting: Certified Nurse Midwife

## 2019-04-02 ENCOUNTER — Other Ambulatory Visit (HOSPITAL_COMMUNITY)
Admission: RE | Admit: 2019-04-02 | Discharge: 2019-04-02 | Disposition: A | Discharge status: Home / Self Care | Payer: BC Managed Care – PPO | Source: Ambulatory Visit | Attending: Certified Nurse Midwife | Admitting: Certified Nurse Midwife

## 2019-04-02 VITALS — BP 111/79 | HR 115 | Ht 71.0 in | Wt 230.5 lb

## 2019-04-02 DIAGNOSIS — Z8639 Personal history of other endocrine, nutritional and metabolic disease: Secondary | ICD-10-CM

## 2019-04-02 DIAGNOSIS — Z124 Encounter for screening for malignant neoplasm of cervix: Secondary | ICD-10-CM

## 2019-04-02 DIAGNOSIS — Z975 Presence of (intrauterine) contraceptive device: Secondary | ICD-10-CM

## 2019-04-02 DIAGNOSIS — Z01419 Encounter for gynecological examination (general) (routine) without abnormal findings: Secondary | ICD-10-CM

## 2019-04-02 DIAGNOSIS — Z3189 Encounter for other procreative management: Secondary | ICD-10-CM

## 2019-04-02 DIAGNOSIS — Z6832 Body mass index (BMI) 32.0-32.9, adult: Secondary | ICD-10-CM

## 2019-04-02 NOTE — Progress Notes (Signed)
Patient here for annual exam.  Patient did modern fertility test and AMH was low, patient wants to discuss this result.

## 2019-04-02 NOTE — Patient Instructions (Signed)
Levonorgestrel intrauterine device (IUD) What is this medicine? LEVONORGESTREL IUD (LEE voe nor jes trel) is a contraceptive (birth control) device. The device is placed inside the uterus by a healthcare professional. It is used to prevent pregnancy. This device can also be used to treat heavy bleeding that occurs during your period. This medicine may be used for other purposes; ask your health care provider or pharmacist if you have questions. COMMON BRAND NAME(S): Minette Headland What should I tell my health care provider before I take this medicine? They need to know if you have any of these conditions:  abnormal Pap smear  cancer of the breast, uterus, or cervix  diabetes  endometritis  genital or pelvic infection now or in the past  have more than one sexual partner or your partner has more than one partner  heart disease  history of an ectopic or tubal pregnancy  immune system problems  IUD in place  liver disease or tumor  problems with blood clots or take blood-thinners  seizures  use intravenous drugs  uterus of unusual shape  vaginal bleeding that has not been explained  an unusual or allergic reaction to levonorgestrel, other hormones, silicone, or polyethylene, medicines, foods, dyes, or preservatives  pregnant or trying to get pregnant  breast-feeding How should I use this medicine? This device is placed inside the uterus by a health care professional. Talk to your pediatrician regarding the use of this medicine in children. Special care may be needed. Overdosage: If you think you have taken too much of this medicine contact a poison control center or emergency room at once. NOTE: This medicine is only for you. Do not share this medicine with others. What if I miss a dose? This does not apply. Depending on the brand of device you have inserted, the device will need to be replaced every 3 to 6 years if you wish to continue using this type  of birth control. What may interact with this medicine? Do not take this medicine with any of the following medications:  amprenavir  bosentan  fosamprenavir This medicine may also interact with the following medications:  aprepitant  armodafinil  barbiturate medicines for inducing sleep or treating seizures  bexarotene  boceprevir  griseofulvin  medicines to treat seizures like carbamazepine, ethotoin, felbamate, oxcarbazepine, phenytoin, topiramate  modafinil  pioglitazone  rifabutin  rifampin  rifapentine  some medicines to treat HIV infection like atazanavir, efavirenz, indinavir, lopinavir, nelfinavir, tipranavir, ritonavir  St. John's wort  warfarin This list may not describe all possible interactions. Give your health care provider a list of all the medicines, herbs, non-prescription drugs, or dietary supplements you use. Also tell them if you smoke, drink alcohol, or use illegal drugs. Some items may interact with your medicine. What should I watch for while using this medicine? Visit your doctor or health care professional for regular check ups. See your doctor if you or your partner has sexual contact with others, becomes HIV positive, or gets a sexual transmitted disease. This product does not protect you against HIV infection (AIDS) or other sexually transmitted diseases. You can check the placement of the IUD yourself by reaching up to the top of your vagina with clean fingers to feel the threads. Do not pull on the threads. It is a good habit to check placement after each menstrual period. Call your doctor right away if you feel more of the IUD than just the threads or if you cannot feel the threads at  all. The IUD may come out by itself. You may become pregnant if the device comes out. If you notice that the IUD has come out use a backup birth control method like condoms and call your health care provider. Using tampons will not change the position of the  IUD and are okay to use during your period. This IUD can be safely scanned with magnetic resonance imaging (MRI) only under specific conditions. Before you have an MRI, tell your healthcare provider that you have an IUD in place, and which type of IUD you have in place. What side effects may I notice from receiving this medicine? Side effects that you should report to your doctor or health care professional as soon as possible:  allergic reactions like skin rash, itching or hives, swelling of the face, lips, or tongue  fever, flu-like symptoms  genital sores  high blood pressure  no menstrual period for 6 weeks during use  pain, swelling, warmth in the leg  pelvic pain or tenderness  severe or sudden headache  signs of pregnancy  stomach cramping  sudden shortness of breath  trouble with balance, talking, or walking  unusual vaginal bleeding, discharge  yellowing of the eyes or skin Side effects that usually do not require medical attention (report to your doctor or health care professional if they continue or are bothersome):  acne  breast pain  change in sex drive or performance  changes in weight  cramping, dizziness, or faintness while the device is being inserted  headache  irregular menstrual bleeding within first 3 to 6 months of use  nausea This list may not describe all possible side effects. Call your doctor for medical advice about side effects. You may report side effects to FDA at 1-800-FDA-1088. Where should I keep my medicine? This does not apply. NOTE: This sheet is a summary. It may not cover all possible information. If you have questions about this medicine, talk to your doctor, pharmacist, or health care provider.  2020 Elsevier/Gold Standard (2017-12-06 13:22:01) Preparing for Pregnancy If you are considering becoming pregnant, make an appointment to see your regular health care provider to learn how to prepare for a safe and healthy  pregnancy (preconception care). During a preconception care visit, your health care provider will:  Do a complete physical exam, including a Pap test.  Take a complete medical history.  Give you information, answer your questions, and help you resolve problems. Preconception checklist Medical history  Tell your health care provider about any current or past medical conditions. Your pregnancy or your ability to become pregnant may be affected by chronic conditions, such as diabetes, chronic hypertension, and thyroid problems.  Include your family's medical history as well as your partner's medical history.  Tell your health care provider about any history of STIs (sexually transmitted infections).These can affect your pregnancy. In some cases, they can be passed to your baby. Discuss any concerns that you have about STIs.  If indicated, discuss the benefits of genetic testing. This testing will show whether there are any genetic conditions that may be passed from you or your partner to your baby.  Tell your health care provider about: ? Any problems you have had with conception or pregnancy. ? Any medicines you take. These include vitamins, herbal supplements, and over-the-counter medicines. ? Your history of immunizations. Discuss any vaccinations that you may need. Diet  Ask your health care provider what to include in a healthy diet that has a balance of nutrients.  This is especially important when you are pregnant or preparing to become pregnant.  Ask your health care provider to help you reach a healthy weight before pregnancy. ? If you are overweight, you may be at higher risk for certain complications, such as high blood pressure, diabetes, and preterm birth. ? If you are underweight, you are more likely to have a baby who has a low birth weight. Lifestyle, work, and home  Let your health care provider know: ? About any lifestyle habits that you have, such as alcohol use, drug  use, or smoking. ? About recreational activities that may put you at risk during pregnancy, such as downhill skiing and certain exercise programs. ? Tell your health care provider about any international travel, especially any travel to places with an active [place] virus outbreak. ? About harmful substances that you may be exposed to at work or at home. These include chemicals, pesticides, radiation, or even litter boxes. ? If you do not feel safe at home. Mental health  Tell your health care provider about: ? Any history of mental health conditions, including feelings of depression, sadness, or anxiety. ? Any medicines that you take for a mental health condition. These include herbs and supplements. Home instructions to prepare for pregnancy Lifestyle   Eat a balanced diet. This includes fresh fruits and vegetables, whole grains, lean meats, low-fat dairy products, healthy fats, and foods that are high in fiber. Ask to meet with a nutritionist or registered dietitian for assistance with meal planning and goals.  Get regular exercise. Try to be active for at least 30 minutes a day on most days of the week. Ask your health care provider which activities are safe during pregnancy.  Do not use any products that contain nicotine or tobacco, such as cigarettes and e-cigarettes. If you need help quitting, ask your health care provider.  Do not drink alcohol.  Do not take illegal drugs.  Maintain a healthy weight. Ask your health care provider what weight range is right for you. General instructions  Keep an accurate record of your menstrual periods. This makes it easier for your health care provider to determine your baby's due date.  Begin taking prenatal vitamins and folic acid supplements daily as directed by your health care provider.  Manage any chronic conditions, such as high blood pressure and diabetes, as told by your health care provider. This is important. How do I know that I am  pregnant? You may be pregnant if you have been sexually active and you miss your period. Symptoms of early pregnancy include:  Mild cramping.  Very light vaginal bleeding (spotting).  Feeling unusually tired.  Nausea and vomiting (morning sickness). If you have any of these symptoms and you suspect that you might be pregnant, you can take a home pregnancy test. These tests check for a hormone in your urine (human chorionic gonadotropin, or hCG). A woman's body begins to make this hormone during early pregnancy. These tests are very accurate. Wait until at least the first day after you miss your period to take one. If the test shows that you are pregnant (you get a positive result), call your health care provider to make an appointment for prenatal care. What should I do if I become pregnant?      Make an appointment with your health care provider as soon as you suspect you are pregnant.  Do not use any products that contain nicotine, such as cigarettes, chewing tobacco, and e-cigarettes. If you  need help quitting, ask your health care provider.  Do not drink alcoholic beverages. Alcohol is related to a number of birth defects.  Avoid toxic odors and chemicals.  You may continue to have sexual intercourse if it does not cause pain or other problems, such as vaginal bleeding. This information is not intended to replace advice given to you by your health care provider. Make sure you discuss any questions you have with your health care provider. Document Revised: 01/27/2017 Document Reviewed: 08/17/2015 Elsevier Patient Education  2020 [place] 63-43 Years Old, Female Preventive care refers to visits with your health care provider and lifestyle choices that can promote health and wellness. This includes:  A yearly physical exam. This may also be called an annual well check.  Regular dental visits and eye exams.  Immunizations.  Screening for certain  conditions.  Healthy lifestyle choices, such as eating a healthy diet, getting regular exercise, not using drugs or products that contain nicotine and tobacco, and limiting alcohol use. What can I expect for my preventive care visit? Physical exam Your health care provider will check your:  Height and weight. This may be used to calculate body mass index (BMI), which tells if you are at a healthy weight.  Heart rate and blood pressure.  Skin for abnormal spots. Counseling Your health care provider may ask you questions about your:  Alcohol, tobacco, and drug use.  Emotional well-being.  Home and relationship well-being.  Sexual activity.  Eating habits.  Work and work Statistician.  Method of birth control.  Menstrual cycle.  Pregnancy history. What immunizations do I need?  Influenza (flu) vaccine  This is recommended every year. Tetanus, diphtheria, and pertussis (Tdap) vaccine  You may need a Td booster every 10 years. Varicella (chickenpox) vaccine  You may need this if you have not been vaccinated. Human papillomavirus (HPV) vaccine  If recommended by your health care provider, you may need three doses over 6 months. Measles, mumps, and rubella (MMR) vaccine  You may need at least one dose of MMR. You may also need a second dose. Meningococcal conjugate (MenACWY) vaccine  One dose is recommended if you are age 64-21 years and a first-year college student living in a residence hall, or if you have one of several medical conditions. You may also need additional booster doses. Pneumococcal conjugate (PCV13) vaccine  You may need this if you have certain conditions and were not previously vaccinated. Pneumococcal polysaccharide (PPSV23) vaccine  You may need one or two doses if you smoke cigarettes or if you have certain conditions. Hepatitis A vaccine  You may need this if you have certain conditions or if you travel or work in places where you may be  exposed to hepatitis A. Hepatitis B vaccine  You may need this if you have certain conditions or if you travel or work in places where you may be exposed to hepatitis B. Haemophilus influenzae type b (Hib) vaccine  You may need this if you have certain conditions. You may receive vaccines as individual doses or as more than one vaccine together in one shot (combination vaccines). Talk with your health care provider about the risks and benefits of combination vaccines. What tests do I need?  Blood tests  Lipid and cholesterol levels. These may be checked every 5 years starting at age 50.  Hepatitis C test.  Hepatitis B test. Screening  Diabetes screening. This is done by checking your blood sugar (glucose) after you have  not eaten for a while (fasting).  Sexually transmitted disease (STD) testing.  BRCA-related cancer screening. This may be done if you have a family history of breast, ovarian, tubal, or peritoneal cancers.  Pelvic exam and Pap test. This may be done every 3 years starting at age 34. Starting at age 71, this may be done every 5 years if you have a Pap test in combination with an HPV test. Talk with your health care provider about your test results, treatment options, and if necessary, the need for more tests. Follow these instructions at home: Eating and drinking   Eat a diet that includes fresh fruits and vegetables, whole grains, lean protein, and low-fat dairy.  Take vitamin and mineral supplements as recommended by your health care provider.  Do not drink alcohol if: ? Your health care provider tells you not to drink. ? You are pregnant, may be pregnant, or are planning to become pregnant.  If you drink alcohol: ? Limit how much you have to 0-1 drink a day. ? Be aware of how much alcohol is in your drink. In the [place] one drink equals one 12 oz bottle of beer (355 mL), one 5 oz glass of wine (148 mL), or one 1 oz glass of hard liquor (44  mL). Lifestyle  Take daily care of your teeth and gums.  Stay active. Exercise for at least 30 minutes on 5 or more days each week.  Do not use any products that contain nicotine or tobacco, such as cigarettes, e-cigarettes, and chewing tobacco. If you need help quitting, ask your health care provider.  If you are sexually active, practice safe sex. Use a condom or other form of birth control (contraception) in order to prevent pregnancy and STIs (sexually transmitted infections). If you plan to become pregnant, see your health care provider for a preconception visit. What's next?  Visit your health care provider once a year for a well check visit.  Ask your health care provider how often you should have your eyes and teeth checked.  Stay up to date on all vaccines. This information is not intended to replace advice given to you by your health care provider. Make sure you discuss any questions you have with your health care provider. Document Revised: 10/06/2017 Document Reviewed: 10/06/2017 Elsevier Patient Education  2020 [employer].

## 2019-04-10 ENCOUNTER — Other Ambulatory Visit: Payer: Self-pay

## 2019-04-11 LAB — VITAMIN D 25 HYDROXY (VIT D DEFICIENCY, FRACTURES): Vit D, 25-Hydroxy: 20.4 ng/mL — ABNORMAL LOW (ref 30.0–100.0)

## 2019-04-11 LAB — CBC
Hematocrit: 43.1 % (ref 34.0–46.6)
Hemoglobin: 14.7 g/dL (ref 11.1–15.9)
MCH: 29.7 pg (ref 26.6–33.0)
MCHC: 34.1 g/dL (ref 31.5–35.7)
MCV: 87 fL (ref 79–97)
Platelets: 380 10*3/uL (ref 150–450)
RBC: 4.95 x10E6/uL (ref 3.77–5.28)
RDW: 12.5 % (ref 11.7–15.4)
WBC: 7.8 10*3/uL (ref 3.4–10.8)

## 2019-04-11 LAB — THYROID PANEL WITH TSH
Free Thyroxine Index: 1.9 (ref 1.2–4.9)
T3 Uptake Ratio: 29 % (ref 24–39)
T4, Total: 6.6 ug/dL (ref 4.5–12.0)
TSH: 2.81 u[IU]/mL (ref 0.450–4.500)

## 2019-04-11 LAB — LIPID PANEL
Chol/HDL Ratio: 2.8 ratio (ref 0.0–4.4)
Cholesterol, Total: 132 mg/dL (ref 100–199)
HDL: 47 mg/dL (ref >39)
LDL Chol Calc (NIH): 69 mg/dL (ref 0–99)
Triglycerides: 82 mg/dL (ref 0–149)
VLDL Cholesterol Cal: 16 mg/dL (ref 5–40)

## 2019-04-11 LAB — HEMOGLOBIN A1C
Est. average glucose Bld gHb Est-mCnc: 105 mg/dL
Hgb A1c MFr Bld: 5.3 % (ref 4.8–5.6)

## 2019-04-16 ENCOUNTER — Other Ambulatory Visit: Payer: Self-pay

## 2019-04-16 MED ORDER — VITAMIN D (ERGOCALCIFEROL) 1.25 MG (50000 UNIT) PO CAPS: 50000.0000 [IU] | ORAL_CAPSULE | ORAL | 3 refills | Status: AC

## 2020-04-04 ENCOUNTER — Encounter: Payer: Self-pay | Admitting: Certified Nurse Midwife

## 2020-04-07 ENCOUNTER — Encounter: Payer: Self-pay | Admitting: Certified Nurse Midwife

## 2020-04-07 ENCOUNTER — Ambulatory Visit (INDEPENDENT_AMBULATORY_CARE_PROVIDER_SITE_OTHER): Payer: Medicaid Other | Admitting: Certified Nurse Midwife

## 2020-04-07 ENCOUNTER — Other Ambulatory Visit: Payer: Self-pay

## 2020-04-07 VITALS — BP 113/82 | HR 72 | Ht 71.0 in | Wt 227.1 lb

## 2020-04-07 DIAGNOSIS — E559 Vitamin D deficiency, unspecified: Secondary | ICD-10-CM

## 2020-04-07 DIAGNOSIS — Z975 Presence of (intrauterine) contraceptive device: Secondary | ICD-10-CM

## 2020-04-07 DIAGNOSIS — Z6831 Body mass index (BMI) 31.0-31.9, adult: Secondary | ICD-10-CM

## 2020-04-07 DIAGNOSIS — Z01419 Encounter for gynecological examination (general) (routine) without abnormal findings: Secondary | ICD-10-CM | POA: Diagnosis not present

## 2020-04-07 NOTE — Progress Notes (Addendum)
ANNUAL PREVENTATIVE CARE GYN  ENCOUNTER NOTE  Subjective:       Kristina Woodard is a 30 y.o. G0P0000 female here for a routine annual gynecologic exam.  Current complaints: 1. IUD string check-plans removal after wedding (June 03, 2020 in [place]) 2. Requests ANNUAL labs  Denies difficulty breathing or respiratory distress, chest pain, abdominal pain, vaginal bleeding, dysuria, and leg pain or swelling.    Gynecologic History  No LMP recorded. (Menstrual status: IUD).  Contraception: IUD, Rutha Bouchard  Last Pap: 03/2019. Results were: normal  Obstetric History  OB History  Gravida Para Term Preterm AB Living  0 0 0 0 0 0  SAB IAB Ectopic Multiple Live Births  0 0 0 0 0    Past Medical History:  Diagnosis Date  . Painful menstrual periods     Past Surgical History:  Procedure Laterality Date  . SKIN BIOPSY      Current Outpatient Medications on File Prior to Visit  Medication Sig Dispense Refill  . amphetamine-dextroamphetamine (ADDERALL XR) 30 MG 24 hr capsule Take 1 capsule by mouth daily.    Marland Kitchen augmented betamethasone dipropionate (DIPROLENE-AF) 0.05 % cream As needed  1  . cetirizine (ZYRTEC) 10 MG tablet Take 10 mg by mouth daily.    Marland Kitchen escitalopram (LEXAPRO) 10 MG tablet     . ibuprofen (ADVIL,MOTRIN) 600 MG tablet Take 1 tablet (600 mg total) by mouth every 8 (eight) hours as needed for cramping. 30 tablet 1  . levonorgestrel (KYLEENA) 19.5 MG IUD 1 Device by Intrauterine route once. Inserted 03/26/16    . traZODone (DESYREL) 50 MG tablet Take 50 mg by mouth at bedtime as needed.     . Vitamin D, Cholecalciferol, 10 MCG (400 UNIT) CAPS Take 1 capsule by mouth daily.    . Vitamin D, Ergocalciferol, (DRISDOL) 1.25 MG (50000 UNIT) CAPS capsule Take 1 capsule (50,000 Units total) by mouth every 7 (seven) days. (Patient not taking: Reported on 04/07/2020) 12 capsule 3   No current facility-administered medications on file prior to visit.    Allergies  Allergen Reactions   . Benadryl [Diphenhydramine]     Social History   Socioeconomic History  . Marital status: Single    Spouse name: Not on file  . Number of children: Not on file  . Years of education: Not on file  . Highest education level: Not on file  Occupational History  . Not on file  Tobacco Use  . Smoking status: Never Smoker  . Smokeless tobacco: Never Used  Vaping Use  . Vaping Use: Never used  Substance and Sexual Activity  . Alcohol use: Not Currently    Comment: 1-2 x per month  . Drug use: No  . Sexual activity: Yes    Birth control/protection: I.U.D.    Comment: Kyleena  Other Topics Concern  . Not on file  Social History Narrative  . Not on file   Social Determinants of Health   Financial Resource Strain: Not on file  Food Insecurity: Not on file  Transportation Needs: Not on file  Physical Activity: Not on file  Stress: Not on file  Social Connections: Not on file  Intimate Partner Violence: Not on file    Family History  Problem Relation Age of Onset  . Cancer Paternal Aunt        ovarian  . Ovarian cancer Paternal Aunt   . Cancer Maternal Grandmother        melanoma ,  metastatic colon Ca  .  Ovarian cancer Paternal Grandmother   . Breast cancer Neg Hx     The following portions of the patient's history were reviewed and updated as appropriate: allergies, current medications, past family history, past medical history, past social history, past surgical history and problem list.  Review of Systems  ROS negative except as noted above. Information obtained from patient.    Objective:   BP 113/82   Pulse 72   Ht 5\' 11"  (1.803 m)   Wt 227 lb 1.6 oz (103 kg)   BMI 31.67 kg/m   CONSTITUTIONAL: Well-developed, well-nourished female in no acute distress.   PSYCHIATRIC: Normal mood and affect. Normal behavior. Normal judgment and thought content.  NEUROLGIC: Alert and oriented to person, place, and time. Normal muscle tone coordination. No cranial nerve  deficit noted.  HENT:  Normocephalic, atraumatic.  EYES: Conjunctivae and EOM are normal.   NECK: Normal range of motion, supple, no masses.  Normal thyroid.   SKIN: Skin is warm and dry. No rash noted. Not diaphoretic. No erythema. No pallor. Professional tattoos present.   CARDIOVASCULAR: Normal heart rate noted, regular rhythm, no murmur.  RESPIRATORY: Clear to auscultation bilaterally. Effort and breath sounds normal, no problems with respiration noted.  BREASTS: Symmetric in size. No masses, skin changes, nipple drainage, or lymphadenopathy.  ABDOMEN: Soft, normal bowel sounds, no distention noted.  No tenderness, rebound or guarding.   PELVIC:  External Genitalia: Normal  Vagina: Normal  Cervix: Normal, IUD strings  Uterus: Normal  Adnexa: Normal  MUSCULOSKELETAL: Normal range of motion. No tenderness.  No cyanosis, clubbing, or edema.  2+ distal pulses.  LYMPHATIC: No Axillary, Supraclavicular, or Inguinal Adenopathy.  Assessment:   Annual gynecologic examination 30 y.o.   Contraception: IUD, Kyleena   Obesity 1   Problem List Items Addressed This Visit      Other   Vitamin D deficiency   Relevant Orders   TSH+T4F+T3Free   Hemoglobin A1c   VITAMIN D 25 Hydroxy (Vit-D Deficiency, Fractures)   IUD (intrauterine device) in place    Other Visit Diagnoses    Encounter for well woman exam with routine gynecological exam    -  Primary   Relevant Orders   TSH+T4F+T3Free   CBC   Hemoglobin A1c   VITAMIN D 25 Hydroxy (Vit-D Deficiency, Fractures)   BMI 31.0-31.9,adult       Relevant Orders   TSH+T4F+T3Free   CBC   Hemoglobin A1c   VITAMIN D 25 Hydroxy (Vit-D Deficiency, Fractures)      Plan:   Pap: Not needed  Labs: See orders  Routine preventative health maintenance measures emphasized: Exercise/Diet/Weight control, Tobacco Warnings, Alcohol/Substance use risks and Stress Management; see AVS  Reviewed red flag symptoms and when to call  Return to  Clinic - 1 Year for 10-24-1966 or sooner if needed   [employer], CNM  [facility] 04/07/20 9:29 AM

## 2020-04-07 NOTE — Patient Instructions (Addendum)
Preventive Care 21-28 Years Old, Female Preventive care refers to lifestyle choices and visits with your health care provider that can promote health and wellness. This includes:  A yearly physical exam. This is also called an annual wellness visit.  Regular dental and eye exams.  Immunizations.  Screening for certain conditions.  Healthy lifestyle choices, such as: ? Eating a healthy diet. ? Getting regular exercise. ? Not using drugs or products that contain nicotine and tobacco. ? Limiting alcohol use. What can I expect for my preventive care visit? Physical exam Your health care provider may check your:  Height and weight. These may be used to calculate your BMI (body mass index). BMI is a measurement that tells if you are at a healthy weight.  Heart rate and blood pressure.  Body temperature.  Skin for abnormal spots. Counseling Your health care provider may ask you questions about your:  Past medical problems.  Family's medical history.  Alcohol, tobacco, and drug use.  Emotional well-being.  Home life and relationship well-being.  Sexual activity.  Diet, exercise, and sleep habits.  Work and work environment.  Access to firearms.  Method of birth control.  Menstrual cycle.  Pregnancy history. What immunizations do I need? Vaccines are usually given at various ages, according to a schedule. Your health care provider will recommend vaccines for you based on your age, medical history, and lifestyle or other factors, such as travel or where you work.   What tests do I need? Blood tests  Lipid and cholesterol levels. These may be checked every 5 years starting at age 20.  Hepatitis C test.  Hepatitis B test. Screening  Diabetes screening. This is done by checking your blood sugar (glucose) after you have not eaten for a while (fasting).  STD (sexually transmitted disease) testing, if you are at risk.  BRCA-related cancer screening. This may be  done if you have a family history of breast, ovarian, tubal, or peritoneal cancers.  Pelvic exam and Pap test. This may be done every 3 years starting at age 21. Starting at age 30, this may be done every 5 years if you have a Pap test in combination with an HPV test. Talk with your health care provider about your test results, treatment options, and if necessary, the need for more tests.   Follow these instructions at home: Eating and drinking  Eat a healthy diet that includes fresh fruits and vegetables, whole grains, lean protein, and low-fat dairy products.  Take vitamin and mineral supplements as recommended by your health care provider.  Do not drink alcohol if: ? Your health care provider tells you not to drink. ? You are pregnant, may be pregnant, or are planning to become pregnant.  If you drink alcohol: ? Limit how much you have to 0-1 drink a day. ? Be aware of how much alcohol is in your drink. In the [place] one drink equals one 12 oz bottle of beer (355 mL), one 5 oz glass of wine (148 mL), or one 1 oz glass of hard liquor (44 mL).   Lifestyle  Take daily care of your teeth and gums. Brush your teeth every morning and night with fluoride toothpaste. Floss one time each day.  Stay active. Exercise for at least 30 minutes 5 or more days each week.  Do not use any products that contain nicotine or tobacco, such as cigarettes, e-cigarettes, and chewing tobacco. If you need help quitting, ask your health care provider.  Do not   use drugs.  If you are sexually active, practice safe sex. Use a condom or other form of protection to prevent STIs (sexually transmitted infections).  If you do not wish to become pregnant, use a form of birth control. If you plan to become pregnant, see your health care provider for a prepregnancy visit.  Find healthy ways to cope with stress, such as: ? Meditation, yoga, or listening to music. ? Journaling. ? Talking to a trusted  person. ? Spending time with friends and family. Safety  Always wear your seat belt while driving or riding in a vehicle.  Do not drive: ? If you have been drinking alcohol. Do not ride with someone who has been drinking. ? When you are tired or distracted. ? While texting.  Wear a helmet and other protective equipment during sports activities.  If you have firearms in your house, make sure you follow all gun safety procedures.  Seek help if you have been physically or sexually abused. What's next?  Go to your health care provider once a year for an annual wellness visit.  Ask your health care provider how often you should have your eyes and teeth checked.  Stay up to date on all vaccines. This information is not intended to replace advice given to you by your health care provider. Make sure you discuss any questions you have with your health care provider. Document Revised: 09/23/2019 Document Reviewed: 10/06/2017 Elsevier Patient Education  2021 [place] Breast self-awareness is knowing how your breasts look and feel. Doing breast self-awareness is important. It allows you to catch a breast problem early while it is still small and can be treated. All women should do breast self-awareness, including women who have had breast implants. Tell your doctor if you notice a change in your breasts. What you need:  A mirror.  A well-lit room. How to do a breast self-exam A breast self-exam is one way to learn what is normal for your breasts and to check for changes. To do a breast self-exam: Look for changes 1. Take off all the clothes above your waist. 2. Stand in front of a mirror in a room with good lighting. 3. Put your hands on your hips. 4. Push your hands down. 5. Look at your breasts and nipples in the mirror to see if one breast or nipple looks different from the other. Check to see if: ? The shape of one breast is different. ? The size of one  breast is different. ? There are wrinkles, dips, and bumps in one breast and not the other. 6. Look at each breast for changes in the skin, such as: ? Redness. ? Scaly areas. 7. Look for changes in your nipples, such as: ? Liquid around the nipples. ? Bleeding. ? Dimpling. ? Redness. ? A change in where the nipples are.   Feel for changes 1. Lie on your back on the floor. 2. Feel each breast. To do this, follow these steps: ? Pick a breast to feel. ? Put the arm closest to that breast above your head. ? Use your other arm to feel the nipple area of your breast. Feel the area with the pads of your three middle fingers by making small circles with your fingers. For the first circle, press lightly. For the second circle, press harder. For the third circle, press even harder. ? Keep making circles with your fingers at the different pressures as you move down your breast. Stop when  you feel your ribs. ? Move your fingers a little toward the center of your body. ? Start making circles with your fingers again, this time going up until you reach your collarbone. ? Keep making up-and-down circles until you reach your armpit. Remember to keep using the three pressures. ? Feel the other breast in the same way. 3. Sit or stand in the tub or shower. 4. With soapy water on your skin, feel each breast the same way you did in step 2 when you were lying on the floor.   Write down what you find Writing down what you find can help you remember what to tell your doctor. Write down:  What is normal for each breast.  Any changes you find in each breast, including: ? The kind of changes you find. ? Whether you have pain. ? Size and location of any lumps. Whe Preparing for Pregnancy If you are planning to become pregnant, talk to your health care provider about preconception care. This type of care helps you prepare for a safe and healthy pregnancy. During this visit, your health care provider will: Do a  complete physical exam, including a Pap test. Take your complete medical history. Give you information, answer your questions, and help you resolve problems. Preconception checklist Medical history Tell your health care provider about any medical conditions you have or have had. Your pregnancy or your ability to become pregnant may be affected by long-term (chronic) conditions, such as: Diabetes. High blood pressure (hypertension). Thyroid problems. Tell your health care provider about your family's medical history and your partner's medical history. Tell your health care provider if you have or have had any sexually transmitted infections, orSTIs. These can affect your pregnancy. In some cases, they can be passed to your baby. If needed, discuss the benefits of genetic testing. This test checks for conditions that may be passed from parent to child. Tell your health care provider about: Any problems you had getting pregnant or while pregnant. Any medicines you take. These include vitamins, herbal supplements, and over-the-counter medicines. Your history of getting vaccines. Discuss any vaccines that you may need. Diet Ask your health care provider about what foods to eat in order to get a balance of nutrients. This is especially important when you are pregnant or preparing to become pregnant. It is recommended that women of childbearing age take a folic acid supplement of 400 mcg daily and eat foods rich in folic acid to prevent certain birth defects. Ask your health care provider to help you reach a healthy weight before pregnancy. If you are overweight, you may have a higher risk for certain problems. These include hypertension, diabetes, and early (preterm) birth. If you are underweight, you are more likely to have a baby who has a low birth weight. Lifestyle, work, and home Let your health care provider know about: Any lifestyle habits that you have, such as use of alcohol, drugs, or  tobacco products. Fun and leisure activities that may put you at risk during pregnancy, such as downhill skiing and certain exercise programs. Any plans to travel out of the country, especially to places with an active [place] virus outbreak. Harmful substances that you may be exposed to at work or at home. These include chemicals, pesticides, radiation, and substances from cat litter boxes. Any concerns you have for your safety at home. Mental health Tell your health care provider about: Any history of mental health conditions, including feelings of depression, sadness, or anxiety. Any medicines  that you take for a mental health condition. These include herbs and supplements. How do I know that I am pregnant? You may be pregnant if you have been sexually active and you miss your period. Other symptoms of early pregnancy include: Mild cramping. Very light vaginal bleeding (spotting). Feeling more tired than usual. Nausea and vomiting. These may be signs of morning sickness. Take a home pregnancy test if you have any of these symptoms. This test checks for a hormone in your urine called human chorionic gonadotropin, or hCG. A woman's body begins to make this hormone during early pregnancy. These tests are very accurate. Wait until at least the first day after you miss your period to take a home pregnancy test. If the test shows that you are pregnant, call your health care provider for a prenatal care visit. What should I do if I become pregnant? Schedule a visit with your health care provider as soon as you suspect you are pregnant. Talk to your health care provider if you are taking prescription medicines to determine if they are safe to take during pregnancy. You may continue to have sex if it does not cause pain or other problems, such as vaginal bleeding. Follow these instructions at home: Eating and drinking Follow instructions from your health care provider about eating or drinking  restrictions. Drink enough fluid to keep your urine pale yellow. Eat a balanced diet. This includes fresh fruits and vegetables, whole grains, lean meats, low-fat dairy products, healthy fats, and foods that are high in fiber. Ask to meet with a nutritionist or registered dietitian for help with meal planning and goals. Avoid eating raw or undercooked meat and seafood. Avoid eating or drinking unpasteurized dairy products.   Lifestyle Get regular exercise. Try to be active for at least 30 minutes a day on most days of the week. Ask your health care provider which activities are safe during pregnancy. Maintain a healthy weight. Avoid toxic fumes and chemicals. Avoid cleaning cat litter boxes. Cat feces may contain a harmful parasite called toxoplasma. Avoid travel to countries where [place] virus is common. Do not use any products that contain nicotine or tobacco, such as cigarettes, e-cigarettes, and chewing tobacco. If you need help quitting, ask your health care provider. Do not drink alcohol or use drugs.      General instructions Keep an accurate record of your menstrual periods. This makes it easier for your health care provider to determine your baby's due date. Take over-the-counter and prescription medicines only as told by your health care provider. Begin taking prenatal vitamins and folic acid supplements daily as directed. Manage any chronic conditions, such as hypertension and diabetes, as told by your health care provider. This is important. Summary If you are planning to become pregnant, talk to your health care provider about preconception care. This is an important part of planning for a healthy pregnancy. Women of childbearing age should take 859 mcg of folic acid daily in addition to eating a diet rich in folic acid. This will prevent certain birth defects. Schedule a visit with your health care provider as soon as you suspect you are pregnant. Tell your health care provider  about your medical history, lifestyle activities, home safety, and other things that may concern you. This information is not intended to replace advice given to you by your health care provider. Make sure you discuss any questions you have with your health care provider. Document Revised: 10/25/2018 Document Reviewed: 10/25/2018 Elsevier Patient Education  [place].  n you last had your menstrual period. General tips  Check your breasts every month.  If you are breastfeeding, the best time to check your breasts is after you feed your baby or after you use a breast pump.  If you get menstrual periods, the best time to check your breasts is 5-7 days after your menstrual period is over.  With time, you will become comfortable with the self-exam, and you will begin to know if there are changes in your breasts. Contact a doctor if you:  See a change in the shape or size of your breasts or nipples.  See a change in the skin of your breast or nipples, such as red or scaly skin.  Have fluid coming from your nipples that is not normal.  Find a lump or thick area that was not there before.  Have pain in your breasts.  Have any concerns about your breast health. Summary  Breast self-awareness includes looking for changes in your breasts, as well as feeling for changes within your breasts.  Breast self-awareness should be done in front of a mirror in a well-lit room.  You should check your breasts every month. If you get menstrual periods, the best time to check your breasts is 5-7 days after your menstrual period is over.  Let your doctor know of any changes you see in your breasts, including changes in size, changes on the skin, pain or tenderness, or fluid from your nipples that is not normal. This information is not intended to replace advice given to you by your health care provider. Make sure you discuss any questions you have with your health care provider. Document  Revised: 09/13/2017 Document Reviewed: 09/13/2017 Elsevier Patient Education  [place].

## 2020-04-07 NOTE — Progress Notes (Signed)
Pt present for annual exam. Pt stated that she was doing well. Pt declined flu vaccine but had all covid vaccines. Pt is getting married soon and is planning to have her IUD removed for pregnancy.

## 2020-04-08 LAB — CBC
Hematocrit: 42.2 % (ref 34.0–46.6)
Hemoglobin: 14.4 g/dL (ref 11.1–15.9)
MCH: 29.5 pg (ref 26.6–33.0)
MCHC: 34.1 g/dL (ref 31.5–35.7)
MCV: 87 fL (ref 79–97)
Platelets: 361 10*3/uL (ref 150–450)
RBC: 4.88 x10E6/uL (ref 3.77–5.28)
RDW: 12.2 % (ref 11.7–15.4)
WBC: 5.3 10*3/uL (ref 3.4–10.8)

## 2020-04-08 LAB — TSH+T4F+T3FREE
Free T4: 1.12 ng/dL (ref 0.82–1.77)
T3, Free: 3.5 pg/mL (ref 2.0–4.4)
TSH: 1.88 u[IU]/mL (ref 0.450–4.500)

## 2020-04-08 LAB — CYTOLOGY - PAP: Diagnosis: NEGATIVE

## 2020-04-08 LAB — VITAMIN D 25 HYDROXY (VIT D DEFICIENCY, FRACTURES): Vit D, 25-Hydroxy: 19.4 ng/mL — ABNORMAL LOW (ref 30.0–100.0)

## 2020-04-08 LAB — HEMOGLOBIN A1C
Est. average glucose Bld gHb Est-mCnc: 108 mg/dL
Hgb A1c MFr Bld: 5.4 % (ref 4.8–5.6)

## 2021-03-24 ENCOUNTER — Telehealth: Payer: Self-pay | Admitting: Obstetrics and Gynecology

## 2021-03-24 NOTE — Telephone Encounter (Signed)
Pt called asking about IUD- states that it expires soone- she is a former [employer] pt- and is requesting clinical staff to call her about a few questions/concerns she has about IUD

## 2021-03-24 NOTE — Telephone Encounter (Signed)
LM w pt in regards to concerns; also made pt aware due for annual and to call office to schedule.

## 2021-04-06 ENCOUNTER — Encounter: Payer: Medicaid Other | Admitting: Certified Nurse Midwife

## 2022-05-05 ENCOUNTER — Ambulatory Visit: Payer: BC Managed Care – PPO | Admitting: Nurse Practitioner
